# Patient Record
Sex: Male | Born: 1955 | Race: White | Hispanic: No | Marital: Single | Smoking: Current every day smoker
Health system: Southern US, Community
[De-identification: ages and names within clinical notes are randomized; demographics above are authoritative.]

## PROBLEM LIST (undated history)

## (undated) DIAGNOSIS — E119 Type 2 diabetes mellitus without complications: Secondary | ICD-10-CM

## (undated) DIAGNOSIS — E7800 Pure hypercholesterolemia, unspecified: Secondary | ICD-10-CM

## (undated) DIAGNOSIS — F17200 Nicotine dependence, unspecified, uncomplicated: Secondary | ICD-10-CM

## (undated) DIAGNOSIS — I10 Essential (primary) hypertension: Secondary | ICD-10-CM

## (undated) DIAGNOSIS — K219 Gastro-esophageal reflux disease without esophagitis: Secondary | ICD-10-CM

---

## 2007-11-09 ENCOUNTER — Emergency Department (HOSPITAL_COMMUNITY): Admission: EM | Admit: 2007-11-09 | Discharge: 2007-11-10 | Payer: Self-pay | Admitting: Emergency Medicine

## 2013-03-31 ENCOUNTER — Encounter (HOSPITAL_COMMUNITY): Payer: Self-pay

## 2013-03-31 ENCOUNTER — Emergency Department (HOSPITAL_COMMUNITY)
Admission: EM | Admit: 2013-03-31 | Discharge: 2013-03-31 | Disposition: A | Discharge status: Home / Self Care | Payer: Self-pay | Attending: Emergency Medicine | Admitting: Emergency Medicine

## 2013-03-31 DIAGNOSIS — IMO0002 Reserved for concepts with insufficient information to code with codable children: Secondary | ICD-10-CM | POA: Insufficient documentation

## 2013-03-31 DIAGNOSIS — S50812A Abrasion of left forearm, initial encounter: Secondary | ICD-10-CM
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
## 2013-03-31 NOTE — ED Notes (Signed)
Waiting on [facility] to take pictures

## 2013-04-01 NOTE — ED Provider Notes (Signed)
Medical screening examination/treatment/procedure(s) were performed by non-physician practitioner and as supervising physician I was immediately available for consultation/collaboration.  Harwood Nall L Armonie Staten, MD 04/01/13 1527 

## 2017-08-04 ENCOUNTER — Encounter (HOSPITAL_COMMUNITY): Payer: Self-pay | Admitting: Emergency Medicine

## 2017-08-04 DIAGNOSIS — Z7984 Long term (current) use of oral hypoglycemic drugs: Secondary | ICD-10-CM | POA: Insufficient documentation

## 2017-08-04 DIAGNOSIS — K0889 Other specified disorders of teeth and supporting structures: Secondary | ICD-10-CM | POA: Insufficient documentation

## 2017-08-04 DIAGNOSIS — R202 Paresthesia of skin: Secondary | ICD-10-CM | POA: Insufficient documentation

## 2017-08-04 DIAGNOSIS — I10 Essential (primary) hypertension: Secondary | ICD-10-CM | POA: Insufficient documentation

## 2017-08-04 DIAGNOSIS — F17200 Nicotine dependence, unspecified, uncomplicated: Secondary | ICD-10-CM | POA: Insufficient documentation

## 2017-08-04 DIAGNOSIS — Z79899 Other long term (current) drug therapy: Secondary | ICD-10-CM | POA: Insufficient documentation

## 2017-08-04 DIAGNOSIS — M545 Low back pain: Secondary | ICD-10-CM | POA: Insufficient documentation

## 2017-08-04 DIAGNOSIS — E119 Type 2 diabetes mellitus without complications: Secondary | ICD-10-CM | POA: Insufficient documentation

## 2017-08-04 NOTE — ED Triage Notes (Signed)
Pt c/o left sided lower back pain that is non-radiating, after lifting a bench earlier today. No bladder/bowel incontinence. Also c/o loose tooth.

## 2017-08-05 ENCOUNTER — Emergency Department (HOSPITAL_COMMUNITY)
Admission: EM | Admit: 2017-08-05 | Discharge: 2017-08-06 | Disposition: A | Discharge status: Home / Self Care | Payer: Self-pay | Attending: Emergency Medicine | Admitting: Emergency Medicine

## 2017-08-05 ENCOUNTER — Encounter (HOSPITAL_COMMUNITY): Payer: Self-pay | Admitting: Emergency Medicine

## 2017-08-05 ENCOUNTER — Emergency Department (HOSPITAL_COMMUNITY): Payer: Self-pay

## 2017-08-05 ENCOUNTER — Emergency Department (HOSPITAL_COMMUNITY)
Admission: EM | Admit: 2017-08-05 | Discharge: 2017-08-05 | Disposition: A | Discharge status: Home / Self Care | Payer: Self-pay | Attending: Emergency Medicine | Admitting: Emergency Medicine

## 2017-08-05 DIAGNOSIS — M545 Low back pain, unspecified: Secondary | ICD-10-CM

## 2017-08-05 DIAGNOSIS — R197 Diarrhea, unspecified: Secondary | ICD-10-CM | POA: Insufficient documentation

## 2017-08-05 DIAGNOSIS — K0889 Other specified disorders of teeth and supporting structures: Secondary | ICD-10-CM

## 2017-08-05 DIAGNOSIS — E119 Type 2 diabetes mellitus without complications: Secondary | ICD-10-CM | POA: Insufficient documentation

## 2017-08-05 DIAGNOSIS — R202 Paresthesia of skin: Secondary | ICD-10-CM

## 2017-08-05 DIAGNOSIS — R112 Nausea with vomiting, unspecified: Secondary | ICD-10-CM | POA: Insufficient documentation

## 2017-08-05 DIAGNOSIS — I10 Essential (primary) hypertension: Secondary | ICD-10-CM | POA: Insufficient documentation

## 2017-08-05 DIAGNOSIS — F17210 Nicotine dependence, cigarettes, uncomplicated: Secondary | ICD-10-CM | POA: Insufficient documentation

## 2017-08-05 HISTORY — DX: Type 2 diabetes mellitus without complications: E11.9

## 2017-08-05 HISTORY — DX: Essential (primary) hypertension: I10

## 2017-08-05 LAB — URINALYSIS, ROUTINE W REFLEX MICROSCOPIC
Bacteria, UA: NONE SEEN
Bilirubin Urine: NEGATIVE
GLUCOSE, UA: NEGATIVE mg/dL
Ketones, ur: NEGATIVE mg/dL
Leukocytes, UA: NEGATIVE
NITRITE: NEGATIVE
PROTEIN: NEGATIVE mg/dL
SPECIFIC GRAVITY, URINE: 1.021 (ref 1.005–1.030)
Squamous Epithelial / LPF: NONE SEEN
pH: 5 (ref 5.0–8.0)

## 2017-08-05 LAB — DIFFERENTIAL
Basophils Absolute: 0 10*3/uL (ref 0.0–0.1)
Basophils Relative: 1 %
Eosinophils Absolute: 0.2 10*3/uL (ref 0.0–0.7)
Eosinophils Relative: 4 %
LYMPHS ABS: 2.5 10*3/uL (ref 0.7–4.0)
Lymphocytes Relative: 38 %
MONO ABS: 0.4 10*3/uL (ref 0.1–1.0)
Monocytes Relative: 6 %
NEUTROS ABS: 3.3 10*3/uL (ref 1.7–7.7)
NEUTROS PCT: 51 %

## 2017-08-05 LAB — CBC
HCT: 43.3 % (ref 39.0–52.0)
HEMOGLOBIN: 14.9 g/dL (ref 13.0–17.0)
MCH: 33.6 pg (ref 26.0–34.0)
MCHC: 34.4 g/dL (ref 30.0–36.0)
MCV: 97.7 fL (ref 78.0–100.0)
Platelets: 192 10*3/uL (ref 150–400)
RBC: 4.43 MIL/uL (ref 4.22–5.81)
RDW: 13.3 % (ref 11.5–15.5)
WBC: 6.5 10*3/uL (ref 4.0–10.5)

## 2017-08-05 MED ORDER — LOPERAMIDE HCL 2 MG PO CAPS
4.0000 mg | ORAL_CAPSULE | Freq: Once | ORAL | Status: AC
  Administered 2017-08-05: 4 mg via ORAL
  Filled 2017-08-05: qty 2

## 2017-08-05 MED ORDER — HYDROCODONE-ACETAMINOPHEN 7.5-325 MG PO TABS: 1.0000 | ORAL_TABLET | Freq: Four times a day (QID) | ORAL | 0 refills | Status: DC | PRN

## 2017-08-05 MED ORDER — SODIUM CHLORIDE 0.9 % IV BOLUS (SEPSIS)
1000.0000 mL | Freq: Once | INTRAVENOUS | Status: AC
  Administered 2017-08-05: 1000 mL via INTRAVENOUS

## 2017-08-05 MED ORDER — METHOCARBAMOL 500 MG PO TABS: 500.0000 mg | ORAL_TABLET | Freq: Two times a day (BID) | ORAL | 0 refills | Status: DC

## 2017-08-05 MED ORDER — ONDANSETRON HCL 4 MG/2ML IJ SOLN
4.0000 mg | Freq: Once | INTRAMUSCULAR | Status: AC
  Administered 2017-08-05: 4 mg via INTRAVENOUS
  Filled 2017-08-05: qty 2

## 2017-08-05 MED ORDER — HYDROCODONE-ACETAMINOPHEN 5-325 MG PO TABS
1.0000 | ORAL_TABLET | Freq: Once | ORAL | Status: AC
  Administered 2017-08-05: 1 via ORAL
  Filled 2017-08-05: qty 1

## 2017-08-05 MED ORDER — CYCLOBENZAPRINE HCL 10 MG PO TABS: 10.0000 mg | ORAL_TABLET | Freq: Two times a day (BID) | ORAL | 0 refills | Status: DC | PRN

## 2017-08-05 NOTE — ED Provider Notes (Signed)
[facility]-EMERGENCY DEPT Provider Note   CSN: [id number] Arrival date & time: 08/05/17  2006     History   Chief Complaint Chief Complaint  Patient presents with  . Abdominal Pain  . Cough    HPI Warren Gray is a 61 y.o. male.  The history is provided by the patient.  he had onset this morning of upper abdominal cramping with associated nausea, vomiting, diarrhea. He reports his pain at 6/10. Pain is somewhat better after vomiting in after having a bowel movement. Nothing makes it worse. He had subjective fever but no chills or sweats. He states he vomited once and had 3 loose bowel movements. There was no blood or mucus in stool or emesis. He isn't complaining of ongoing sense of rectal urgency and ongoing nausea. He denies any sick contacts. Of note, he had been seen in emergency last night for low back pain with associated scrotal paresthesia. His back is feeling much better and he no longer has scrotal paresthesia.  Past Medical History:  Diagnosis Date  . Diabetes mellitus without complication (HCC)   . Hypertension     There are no active problems to display for this patient.   History reviewed. No pertinent surgical history.     Home Medications    Prior to Admission medications   Medication Sig Start Date End Date Taking? Authorizing Provider  HYDROcodone-acetaminophen (NORCO) 7.5-325 MG tablet Take 1 tablet by mouth every 6 (six) hours as needed for moderate pain. 08/05/17   Maxwell Caul, PA-C  lisinopril (PRINIVIL,ZESTRIL) 20 MG tablet Take 10 mg by mouth daily. 07/24/17   [provider]  metFORMIN (GLUMETZA) 500 MG (MOD) 24 hr tablet Take 500 mg by mouth daily. 07/24/17   [provider]  methocarbamol (ROBAXIN) 500 MG tablet Take 1 tablet (500 mg total) by mouth 2 (two) times daily. 08/05/17   Maxwell Caul, PA-C    Family History No family history on file.  Social History Social History  Substance Use Topics  . Smoking status:  Current Every Day Smoker    Packs/day: 1.00    Types: Cigarettes  . Smokeless tobacco: Never Used  . Alcohol use Yes     Allergies   Zoloft [sertraline hcl]   Review of Systems Review of Systems  All other systems reviewed and are negative.    Physical Exam Updated Vital Signs BP (!) 178/105   Pulse 75   Temp 97.8 F (36.6 C) (Oral)   Resp 18   Ht  (1.702 m)   Wt 70.3 kg (155 lb)   SpO2 99%   BMI 24.28 kg/m   Physical Exam  Nursing note and vitals reviewed.  61 year old male, resting comfortably and in no acute distress. Vital signs are significant for hypertension. Oxygen saturation is 99%, which is normal. Head is normocephalic and atraumatic. PERRLA, EOMI. Oropharynx is clear. Neck is nontender and supple without adenopathy or JVD. Back is nontender and there is no CVA tenderness. Lungs are clear without rales, wheezes, or rhonchi. Chest is nontender. Heart has regular rate and rhythm without murmur. Abdomen is soft, flat, with mild epigastric tenderness. There is no rebound or guarding. There are no masses or hepatosplenomegaly and peristalsis is hypoactive. Extremities have trace edema, full range of motion is present. Skin is warm and dry without rash. Neurologic: Mental status is normal, cranial nerves are intact, there are no motor or sensory deficits.  ED Treatments / Results  Labs (all labs ordered  are listed, but only abnormal results are displayed) Labs Reviewed  LIPASE, BLOOD - Abnormal; Notable for the following:       Result Value   Lipase 58 (*)    All other components within normal limits  URINALYSIS, ROUTINE W REFLEX MICROSCOPIC - Abnormal; Notable for the following:    Hgb urine dipstick SMALL (*)    All other components within normal limits  COMPREHENSIVE METABOLIC PANEL  CBC  DIFFERENTIAL    Procedures Procedures (including critical care time)  Medications Ordered in ED Medications  sodium chloride 0.9 % bolus 1,000 mL (not  administered)  loperamide (IMODIUM) capsule 4 mg (not administered)  ondansetron (ZOFRAN) injection 4 mg (not administered)     Initial Impression / Assessment and Plan / ED Course  I have reviewed the triage vital signs and the nursing notes.  Pertinent lab results that were available during my care of the patient were reviewed by me and considered in my medical decision making (see chart for details).  Abdominal cramping, nausea, vomiting, diarrhea in pattern strongly suggestive of viral gastroenteritis. No red flags to suggest more serious illness. We'll check screening labs and give IV fluids, ondansetron, loperamide. Old records are reviewed confirming ED visit for low back pain and scrotal paresthesia last night with MRI showing multiple areas of disc bulging.  He feels much better after above noted treatment. He is discharged with prescription for ondansetron and told to use over-the-counter loperamide.  Final Clinical Impressions(s) / ED Diagnoses   Final diagnoses:  Nausea vomiting and diarrhea    New Prescriptions New Prescriptions   ONDANSETRON (ZOFRAN) 4 MG TABLET    Take 1 tablet (4 mg total) by mouth every 6 (six) hours as needed for nausea.     Dione Booze, MD 08/06/17 [phone]

## 2017-08-05 NOTE — ED Triage Notes (Signed)
Pt comes in with complaints of generalized abdominal pain with diarrhea.  Endorses cough that makes him feel like he will get sick.  Endorses cold chills. Was seen at [facility] earlier today for unrelated issue. Ambulatory. A&O x4.

## 2017-08-05 NOTE — Discharge Instructions (Signed)
You can take Tylenol or Ibuprofen as directed for pain. You can alternate Tylenol and Ibuprofen every 4 hours. If you take Tylenol at 1pm, then you can take Ibuprofen at 5pm. Then you can take Tylenol again at 9pm.   You can take the pain medication for severe or breakthrough pain. Do not take the pain medication at the same time as tylenol or ibuprofen.   Take Robaxin as prescribed. This medication will make you drowsy so do not drive or drink alcohol when taking it.  As we discussed, he will need to follow up with the referred neurosurgery provider For evaluation of the bulging disc. Call them and arrange for an appointment for further outpatient evaluation.   Follow-up with the referred dentists for further dental treatment.   Return to the Emergency Department immediately for any worsening back pain, neck pain, difficulty walking, numbness/weaknss of your arms or legs, urinary or bowel accidents, fever or any other worsening or concerning symptoms.   CALL YOUR DENTIST OR RETURN IMMEDIATELY IF you develop a fever, rash, difficulty breathing or swallowing, neck or facial swelling, or other potentially serious concerns.
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
## 2017-08-05 NOTE — ED Provider Notes (Signed)
[facility]-EMERGENCY DEPT Provider Note   CSN: [id number] Arrival date & time: 08/04/17  2307     History   Chief Complaint Chief Complaint  Patient presents with  . Back Pain  . Dental Pain    HPI Warren Gray is a 61 y.o. male began this evening after lifting a bench. Patient states that he was attempting to carry the bench when he started experiencing left-sided lower back pain. He states that the pain does not radiate but stays right at the left lower back.Patient states that he has been able to ambulate since this happened. He reports he has been taking ibuprofen with no improvement in symptoms. Patient does report that he is having some numbness to his scrotum. He states that he is able to feel but that the right side just feels "all numb." Patient also reports that he has a right lower tooth that is loose that has been causing him pain. He has not been able to be seen by a dentist recently. He denies any fevers, chills, difficulty swallowing difficulty eating, facial swelling.Denies fevers, weight loss, numbness/weakness of upper and lower extremities, bowel/bladder incontinence, history of back surgery, history of IVDA.    The history is provided by the patient.    Past Medical History:  Diagnosis Date  . Diabetes mellitus without complication (HCC)   . Hypertension     There are no active problems to display for this patient.   History reviewed. No pertinent surgical history.   Home Medications    Prior to Admission medications   Medication Sig Start Date End Date Taking? Authorizing Provider  lisinopril (PRINIVIL,ZESTRIL) 20 MG tablet Take 10 mg by mouth daily. 07/24/17  Yes [provider]  metFORMIN (GLUMETZA) 500 MG (MOD) 24 hr tablet Take 500 mg by mouth daily. 07/24/17  Yes [provider]  cyclobenzaprine (FLEXERIL) 10 MG tablet Take 1 tablet (10 mg total) by mouth 2 (two) times daily as needed for muscle spasms. 08/05/17   Maxwell Caul, PA-C     Family History No family history on file.  Social History Social History  Substance Use Topics  . Smoking status: Current Every Day Smoker  . Smokeless tobacco: Never Used  . Alcohol use Yes     Allergies   Zoloft [sertraline hcl]   Review of Systems Review of Systems  Constitutional: Negative for fever.  HENT: Positive for dental problem. Negative for congestion.   Respiratory: Negative for cough and shortness of breath.   Cardiovascular: Negative for chest pain.  Gastrointestinal: Negative for abdominal pain, diarrhea, nausea and vomiting.  Genitourinary: Negative for dysuria and hematuria.  Musculoskeletal: Positive for back pain. Negative for gait problem and neck pain.  Skin: Negative for rash.  Neurological: Positive for numbness. Negative for weakness.     Physical Exam Updated Vital Signs BP 134/73   Pulse 77   Temp 97.8 F (36.6 C) (Oral)   Resp 18   Ht  (1.702 m)   Wt 70.3 kg (155 lb)   SpO2 97%   BMI 24.28 kg/m   Physical Exam  Constitutional: He is oriented to person, place, and time. He appears well-developed and well-nourished.  Sleeping comfortably on examination table.   HENT:  Head: Normocephalic and atraumatic.  Mouth/Throat: Uvula is midline, oropharynx is clear and moist and mucous membranes are normal. No trismus in the jaw. Abnormal dentition.  Multiple missing teeth. Patient's tooth #28 is slightly loose. No surrounding gingival erythema or swelling. No evidence of  dental abscess. No fluctuance noted. Uvula is midline. No evidence of trismus. No facial or neck swelling.  Eyes: Pupils are equal, round, and reactive to light. Conjunctivae, EOM and lids are normal.  Neck: Full passive range of motion without pain.  Full flexion/extension and lateral movement of neck fully intact. No bony midline tenderness. No deformities or crepitus.   Cardiovascular: Normal rate, regular rhythm, normal heart sounds and normal pulses.  Exam reveals  no gallop and no friction rub.   No murmur heard. Pulmonary/Chest: Effort normal and breath sounds normal.  Abdominal: Soft. Normal appearance. There is no tenderness. There is no rigidity and no guarding. Hernia confirmed negative in the right inguinal area and confirmed negative in the left inguinal area.  Genitourinary: Rectum normal, testes normal and penis normal. Rectal exam shows no mass, no tenderness and anal tone normal. Right testis shows no mass, no swelling and no tenderness. Left testis shows no mass, no swelling and no tenderness. Uncircumcised.  Genitourinary Comments: The exam was performed with a chaperone present. I performed a DRE that showed normal anal tone. Normal male genitalia. No evidence of erythema, swelling, warmth noted to bilateral testicles.  Musculoskeletal: Normal range of motion.       Thoracic back: He exhibits no tenderness.       Back:  Diffuse muscular tenderness overlying the lower lumbar region, most notably on the paraspinal muscles of the bilateral lumbar region. No deformity or crepitus noted.  Neurological: He is alert and oriented to person, place, and time.  Follows commands, Moves all extremities  5/5 strength to BUE and BLE  Sensation intact throughout all major nerve distributions of the BUE and BLE Normal gait   Skin: Skin is warm and dry. Capillary refill takes less than 2 seconds.  Psychiatric: He has a normal mood and affect. His speech is normal.  Nursing note and vitals reviewed.    ED Treatments / Results  Labs (all labs ordered are listed, but only abnormal results are displayed) Labs Reviewed - No data to display  EKG  EKG Interpretation None       Radiology Mr Lumbar Spine Wo Contrast  Result Date: 08/05/2017 CLINICAL DATA:  Back pain after lifting injury EXAM: MRI LUMBAR SPINE WITHOUT CONTRAST TECHNIQUE: Multiplanar, multisequence MR imaging of the lumbar spine was performed. No intravenous contrast was administered.  COMPARISON:  None. FINDINGS: Segmentation:  Standard. Alignment:  Physiologic. Vertebrae:  No fracture, evidence of discitis, or bone lesion. Conus medullaris: Extends to the L1 level and appears normal. Paraspinal and other soft tissues: Negative. Disc levels: The intervertebral disc spaces at L3-4 and above are unremarkable. L4-L5: Medium-sized disc bulge, left eccentric, in close proximity to the left L4 nerve root in the extraforaminal zone. Mild central spinal canal stenosis. Severe bilateral neural foraminal stenosis. Moderate bilateral facet hypertrophy. L5-S1: Severe right facet hypertrophy with right-greater-than-left endplate ridging and small central disc protrusion. Severe right neural foraminal stenosis. No spinal canal or left neural foraminal stenosis. IMPRESSION: 1. Medium-sized L4-5 disc bulge, eccentric to the left, contacting the left L4 nerve root in the extraforaminal zone. Mild spinal canal and severe bilateral neural foraminal stenosis at this level. 2. Severe right L5-S1 neural foraminal stenosis due to combination of right facet hypertrophy and right eccentric disc osteophyte complex. Electronically Signed   By: Deatra Robinson M.D.   On: 08/05/2017 05:01    Procedures Procedures (including critical care time)  Medications Ordered in ED Medications  HYDROcodone-acetaminophen (NORCO/VICODIN) 5-325 MG per  tablet 1 tablet (1 tablet Oral Given 08/05/17 0341)     Initial Impression / Assessment and Plan / ED Course  I have reviewed the triage vital signs and the nursing notes.  Pertinent labs & imaging results that were available during my care of the patient were reviewed by me and considered in my medical decision making (see chart for details).     61 year old male who presents with left sided lower back pain that began after lifting event. Does also report some numbness in his groin. No gait abnormalities, no numbness/weakness of his arms or legs. Patient is afebrile,  non-toxic appearing, sitting comfortably on examination table. Vital signs reviewed and stable. Normal gait. Concern for musculoskeletal injury versus acute fracture versus dislocation. History/physical examination concerning for spinal abscess or cauda equina. No evidence of dental abscess. History/physical exam or not concerning for peritonsillar abscess, Ludwig angina. Discussed patient with Dr. Preston Fleeting. Given patient's symptoms and concern for saddle anesthesia will obtain MRI of lumbar back. Analgesics provided in the department. Updated patient and wife on plan. They're in agreement.  MRI lumbar spine shows L4-L5 disc bulge with eccentric to the left contacting the left L4 nerve root. There is mention of mild spinal canal and bilateral neuro foraminal stenosis. There is also stenosis noted at L5-S1 level. Discussed patient with Dr. Preston Fleeting. We'll plan to provide outpatient neurosurgery referral for patient to follow-up with.  Discussed results with patient and wife. Patient reports improvement in pain after pain medication. Repeat neuro exam is normal. 5/5 strength BUE and BLE. I personally ambulated the patient and he has a normal gait and no difficulties walking. Discussed plan with patient for treatment with muscle relaxers and NSAIDs. Instructed patient to follow-up with neurosurgery referral for further evaluation. Will also plan to write patient with outpatient dentist referral that he can follow-up with for possible tooth extraction. Strict return precautions discussed. Patient expresses understanding and agreement to plan.   As RN was attempting to discharge patient, wife became concerned about prescriptions. She states the patient is not able to tolerate Flexeril because it makes him nauseous. Offered to prescribe Zofran but wife does not want him to have it. We discussed other potential muscle relaxer options. Wife is also concerned about managing his pain although patient reports that his pain is  improved after initial pain medication and has been ambulatory since. Encouraged use of NSAIDs for pain relief but wife is concerned that one was strong enough. Will give a very short course of pain medications for symptomatic relief. Patient and wife are agreeable to plan.   Final Clinical Impressions(s) / ED Diagnoses   Final diagnoses:  Acute left-sided low back pain without sciatica  Paresthesia  Pain, dental    New Prescriptions New Prescriptions   CYCLOBENZAPRINE (FLEXERIL) 10 MG TABLET    Take 1 tablet (10 mg total) by mouth 2 (two) times daily as needed for muscle spasms.     Maxwell Caul, PA-C 08/05/17 1610    Dione Booze, MD 08/05/17 2300

## 2017-08-06 LAB — COMPREHENSIVE METABOLIC PANEL
ALBUMIN: 4.2 g/dL (ref 3.5–5.0)
ALK PHOS: 80 U/L (ref 38–126)
ALT: 25 U/L (ref 17–63)
ANION GAP: 9 (ref 5–15)
AST: 28 U/L (ref 15–41)
BUN: 15 mg/dL (ref 6–20)
CHLORIDE: 108 mmol/L (ref 101–111)
CO2: 25 mmol/L (ref 22–32)
Calcium: 9.7 mg/dL (ref 8.9–10.3)
Creatinine, Ser: 0.89 mg/dL (ref 0.61–1.24)
GFR calc non Af Amer: >60 mL/min (ref >60)
GLUCOSE: 96 mg/dL (ref 65–99)
Potassium: 3.9 mmol/L (ref 3.5–5.1)
SODIUM: 142 mmol/L (ref 135–145)
Total Bilirubin: 0.7 mg/dL (ref 0.3–1.2)
Total Protein: 7.2 g/dL (ref 6.5–8.1)

## 2017-08-06 LAB — LIPASE, BLOOD: LIPASE: 58 U/L — AB (ref 11–51)

## 2017-08-06 MED ORDER — ONDANSETRON HCL 4 MG PO TABS: 4.0000 mg | ORAL_TABLET | Freq: Four times a day (QID) | ORAL | 0 refills | Status: DC | PRN

## 2017-08-06 NOTE — Discharge Instructions (Signed)
Take loperamide (Imodium A-D) as needed for diarrhea. ?

## 2017-08-09 ENCOUNTER — Telehealth: Payer: Self-pay | Admitting: *Deleted

## 2017-08-09 NOTE — Telephone Encounter (Signed)
Pharmacy called to verify [place]

## 2018-04-21 ENCOUNTER — Other Ambulatory Visit: Payer: Self-pay

## 2018-04-21 ENCOUNTER — Emergency Department (HOSPITAL_COMMUNITY)
Admission: EM | Admit: 2018-04-21 | Discharge: 2018-04-21 | Disposition: A | Discharge status: Home / Self Care | Payer: Medicaid - Out of State | Attending: Emergency Medicine | Admitting: Emergency Medicine

## 2018-04-21 ENCOUNTER — Encounter (HOSPITAL_COMMUNITY): Payer: Self-pay | Admitting: Emergency Medicine

## 2018-04-21 DIAGNOSIS — I10 Essential (primary) hypertension: Secondary | ICD-10-CM | POA: Insufficient documentation

## 2018-04-21 DIAGNOSIS — B356 Tinea cruris: Secondary | ICD-10-CM | POA: Insufficient documentation

## 2018-04-21 DIAGNOSIS — Z7984 Long term (current) use of oral hypoglycemic drugs: Secondary | ICD-10-CM | POA: Insufficient documentation

## 2018-04-21 DIAGNOSIS — F17210 Nicotine dependence, cigarettes, uncomplicated: Secondary | ICD-10-CM | POA: Insufficient documentation

## 2018-04-21 DIAGNOSIS — Z79899 Other long term (current) drug therapy: Secondary | ICD-10-CM | POA: Insufficient documentation

## 2018-04-21 DIAGNOSIS — E119 Type 2 diabetes mellitus without complications: Secondary | ICD-10-CM | POA: Diagnosis not present

## 2018-04-21 DIAGNOSIS — L299 Pruritus, unspecified: Secondary | ICD-10-CM | POA: Diagnosis present

## 2018-04-21 LAB — URINALYSIS, ROUTINE W REFLEX MICROSCOPIC
Bilirubin Urine: NEGATIVE
Glucose, UA: NEGATIVE mg/dL
Hgb urine dipstick: NEGATIVE
Ketones, ur: NEGATIVE mg/dL
Leukocytes, UA: NEGATIVE
Nitrite: NEGATIVE
Protein, ur: NEGATIVE mg/dL
Specific Gravity, Urine: 1.024 (ref 1.005–1.030)
pH: 5 (ref 5.0–8.0)

## 2018-04-21 LAB — CBG MONITORING, ED: Glucose-Capillary: 96 mg/dL (ref 65–99)

## 2018-04-21 MED ORDER — CEPHALEXIN 500 MG PO CAPS: 500.0000 mg | ORAL_CAPSULE | Freq: Four times a day (QID) | ORAL | 0 refills | Status: DC

## 2018-04-21 MED ORDER — KETOCONAZOLE 2 % EX CREA: 1.0000 "application " | TOPICAL_CREAM | Freq: Every day | CUTANEOUS | 0 refills | Status: DC

## 2018-04-21 NOTE — ED Triage Notes (Signed)
C/o groin itching since Friday.  States he was seen 2 weeks ago for same and used cream that initially helped but then it got worse.

## 2018-04-21 NOTE — ED Provider Notes (Signed)
[facility]   CSN: [id number] Arrival date & time: 04/21/18  2120     History   Chief Complaint Chief Complaint  Patient presents with  . groin itching    HPI Warren Gray is a 62 y.o. male.  The history is provided by the patient. No language interpreter was used.    Warren Gray is a 62 y.o. male who presents to the Emergency Department complaining of groin itching. He reports two weeks of itching in his groin, right greater than left. 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 He was seen in the emergency department two weeks ago with steroid cream but is not sure what it is. He denies any fevers, chest pain, abdominal pain, shortness of breath, nausea, vomiting, dysuria. No penile discharge. No new sexual partners. He does have a history of diabetes and does not check his sugar. He is unsure of his home medications. He does drink alcohol heavily and drinks at least a pint of liquor a day.  Past Medical History:  Diagnosis Date  . Diabetes mellitus without complication (HCC)   . Hypertension     There are no active problems to display for this patient.   History reviewed. No pertinent surgical history.      Home Medications    Prior to Admission medications   Medication Sig Start Date End Date Taking? Authorizing Provider  HYDROcodone-acetaminophen (NORCO) 7.5-325 MG tablet Take 1 tablet by mouth every 6 (six) hours as needed for moderate pain. 08/05/17   Maxwell CaulLayden, Lindsey A, PA-C  lisinopril (PRINIVIL,ZESTRIL) 20 MG tablet Take 10 mg by mouth daily. 07/24/17   [provider]  metFORMIN (GLUMETZA) 500 MG (MOD) 24 hr tablet Take 500 mg by mouth daily. 07/24/17   [provider]  methocarbamol (ROBAXIN) 500 MG tablet Take 1 tablet (500 mg total) by mouth 2 (two) times daily. 08/05/17   Maxwell CaulLayden, Lindsey A, PA-C  ondansetron (ZOFRAN) 4 MG tablet Take 1 tablet (4 mg total) by mouth every 6 (six) hours  as needed for nausea. 08/06/17   Dione BoozeGlick, David, MD    Family History No family history on file.  Social History Social History   Tobacco Use  . Smoking status: Current Every Day Smoker    Packs/day: 1.00    Types: Cigarettes  . Smokeless tobacco: Never Used  Substance Use Topics  . Alcohol use: Yes  . Drug use: No     Allergies   Zoloft [sertraline hcl]   Review of Systems Review of Systems  All other systems reviewed and are negative.    Physical Exam Updated Vital Signs BP (!) 170/96 (BP Location: Right Arm)   Pulse 79   Temp 98.6 F (37 C) (Oral)   Resp 16   Ht 5\' 7"  (1.702 m)   Wt 72.6 kg (160 lb)   SpO2 96%   BMI 25.06 kg/m   Physical Exam  Constitutional: He is oriented to person, place, and time. He appears well-developed and well-nourished.  HENT:  Head: Normocephalic and atraumatic.  Cardiovascular: Normal rate and regular rhythm.  No murmur heard. Pulmonary/Chest: Effort normal and breath sounds normal. No respiratory distress.  Abdominal: Soft. There is no tenderness. There is no rebound and no guarding.  Genitourinary:  Genitourinary Comments: 2+ femoral pulses bilaterally.  There is a serpiginous rash to the right upper thigh extending to the inguinal crease. No significant local tenderness. There is no scrotal edema  or tenderness.  Musculoskeletal: He exhibits no edema or tenderness.  Neurological: He is alert and oriented to person, place, and time.  Skin: Skin is warm and dry.  Psychiatric: He has a normal mood and affect. His behavior is normal.  Nursing note and vitals reviewed.    ED Treatments / Results  Labs (all labs ordered are listed, but only abnormal results are displayed) Labs Reviewed  URINALYSIS, ROUTINE W REFLEX MICROSCOPIC  CBG MONITORING, ED    EKG None  Radiology No results found.  Procedures Procedures (including critical care time)  Medications Ordered in ED Medications - No data to display   Initial  Impression / Assessment and Plan / ED Course  I have reviewed the triage vital signs and the nursing notes.  Pertinent labs & imaging results that were available during my care of the patient were reviewed by me and considered in my medical decision making (see chart for details).     Patient with history of diabetes, alcohol abuse here for evaluation of groin itching. He is non-toxic appearing on examination. Examination is consistent with tinea curious. There is a surrounding margin of erythema concerning for possible developing cellulitis. There are no concerning features for abscess, necrotizing soft tissue infection, hernia. Counseled patient on home care, outpatient follow-up and return precautions.  Final Clinical Impressions(s) / ED Diagnoses   Final diagnoses:  Tinea cruris    ED Discharge Orders    None       Tilden Fossa, MD 04/21/18 2320

## 2018-04-21 NOTE — ED Notes (Signed)
Delay in lab draw,  Provider at bedside. 

## 2018-04-24 ENCOUNTER — Emergency Department (HOSPITAL_COMMUNITY)
Admission: EM | Admit: 2018-04-24 | Discharge: 2018-04-24 | Disposition: A | Discharge status: Home / Self Care | Payer: Medicaid - Out of State | Attending: Emergency Medicine | Admitting: Emergency Medicine

## 2018-04-24 ENCOUNTER — Other Ambulatory Visit: Payer: Self-pay

## 2018-04-24 ENCOUNTER — Encounter (HOSPITAL_COMMUNITY): Payer: Self-pay | Admitting: Emergency Medicine

## 2018-04-24 DIAGNOSIS — Z79899 Other long term (current) drug therapy: Secondary | ICD-10-CM | POA: Diagnosis not present

## 2018-04-24 DIAGNOSIS — M549 Dorsalgia, unspecified: Secondary | ICD-10-CM | POA: Diagnosis present

## 2018-04-24 DIAGNOSIS — F17210 Nicotine dependence, cigarettes, uncomplicated: Secondary | ICD-10-CM | POA: Insufficient documentation

## 2018-04-24 DIAGNOSIS — E119 Type 2 diabetes mellitus without complications: Secondary | ICD-10-CM | POA: Insufficient documentation

## 2018-04-24 DIAGNOSIS — Z7984 Long term (current) use of oral hypoglycemic drugs: Secondary | ICD-10-CM | POA: Diagnosis not present

## 2018-04-24 DIAGNOSIS — M62830 Muscle spasm of back: Secondary | ICD-10-CM

## 2018-04-24 DIAGNOSIS — I10 Essential (primary) hypertension: Secondary | ICD-10-CM | POA: Diagnosis not present

## 2018-04-24 MED ORDER — METHOCARBAMOL 500 MG PO TABS: 500.0000 mg | ORAL_TABLET | Freq: Two times a day (BID) | ORAL | 0 refills | Status: DC

## 2018-04-24 MED ORDER — ACETAMINOPHEN 325 MG PO TABS: 650.0000 mg | ORAL_TABLET | Freq: Four times a day (QID) | ORAL | 0 refills | Status: DC | PRN

## 2018-04-24 NOTE — Discharge Instructions (Addendum)
We saw in the ER for your back pain, which appears to be due to muscle spasms. As discussed, massage the area of pain and spasms.  Heat compresses also are helpful.  Take the muscle relaxants as needed.

## 2018-04-24 NOTE — ED Provider Notes (Signed)
[facility] EMERGENCY DEPARTMENT Provider Note   CSN: [id number] Arrival date & time: 04/24/18  0040     History   Chief Complaint Chief Complaint  Patient presents with  . Back Pain    HPI Warren Gray is a 62 y.o. male.  HPI  62 year old male with history of diabetes and hypertension comes in with chief complaint of back pain. Patient states that he was moving a crate when he started noticing pain in his back.  Pain is located in the left lower back laterally.  Pain is constant and worse with movement.  Patient has no history of similar pain in the past. Pt has no associated numbness, weakness, urinary incontinence, urinary retention, bowel incontinence, pins and needle sensation in the perineal area.    Past Medical History:  Diagnosis Date  . Diabetes mellitus without complication (HCC)   . Hypertension     There are no active problems to display for this patient.   History reviewed. No pertinent surgical history.      Home Medications    Prior to Admission medications   Medication Sig Start Date End Date Taking? Authorizing Provider  acetaminophen (TYLENOL) 325 MG tablet Take 2 tablets (650 mg total) by mouth every 6 (six) hours as needed. 04/24/18   [name], MD  cephALEXin (KEFLEX) 500 MG capsule Take 1 capsule (500 mg total) by mouth 4 (four) times daily. 04/21/18   Tilden Fossa, MD  HYDROcodone-acetaminophen [facility]) 7.5-325 MG tablet Take 1 tablet by mouth every 6 (six) hours as needed for moderate pain. 08/05/17   Maxwell Caul, PA-C  ketoconazole (NIZORAL) 2 % cream Apply 1 application topically daily. 04/21/18   Tilden Fossa, MD  lisinopril (PRINIVIL,ZESTRIL) 20 MG tablet Take 10 mg by mouth daily. 07/24/17   [provider]  metFORMIN (GLUMETZA) 500 MG (MOD) 24 hr tablet Take 500 mg by mouth daily. 07/24/17   [provider]  methocarbamol (ROBAXIN) 500 MG tablet Take 1 tablet (500 mg total) by mouth 2 (two)  times daily. 04/24/18   [name], MD  ondansetron (ZOFRAN) 4 MG tablet Take 1 tablet (4 mg total) by mouth every 6 (six) hours as needed for nausea. 08/06/17   Dione Booze, MD    Family History No family history on file.  Social History Social History   Tobacco Use  . Smoking status: Current Every Day Smoker    Packs/day: 1.00    Types: Cigarettes  . Smokeless tobacco: Never Used  Substance Use Topics  . Alcohol use: Yes  . Drug use: No     Allergies   Zoloft [sertraline hcl]   Review of Systems Review of Systems  Constitutional: Positive for activity change.  Genitourinary: Negative for difficulty urinating.  Musculoskeletal: Positive for back pain.  Neurological: Negative for weakness and numbness.     Physical Exam Updated Vital Signs BP 115/74   Pulse 86   Temp 98.3 F (36.8 C) (Oral)   Resp 20   SpO2 100%   Physical Exam  Constitutional: He is oriented to person, place, and time. He appears well-developed.  HENT:  Head: Normocephalic and atraumatic.  Eyes: Pupils are equal, round, and reactive to light. Conjunctivae and EOM are normal.  Neck: Normal range of motion. Neck supple.  Cardiovascular: Normal rate and regular rhythm.  Pulmonary/Chest: Effort normal and breath sounds normal.  Abdominal: Soft. Bowel sounds are normal. He exhibits no distension. There is no tenderness. There is no rebound and no  guarding.  Musculoskeletal:  Patient has no tenderness over the spine. He is noted to have reproducible tenderness over the left lower back laterally, where there is a palpable spasm.  Tenderness is worse with manipulation of the spasm. Patient has normal sensory exam and is able to ambulate/  Neurological: He is alert and oriented to person, place, and time.  Skin: Skin is warm.  Nursing note and vitals reviewed.    ED Treatments / Results  Labs (all labs ordered are listed, but only abnormal results are displayed) Labs Reviewed - No data to  display  EKG None  Radiology No results found.  Procedures Procedures (including critical care time)  Medications Ordered in ED Medications - No data to display   Initial Impression / Assessment and Plan / ED Course  I have reviewed the triage vital signs and the nursing notes.  Pertinent labs & imaging results that were available during my care of the patient were reviewed by me and considered in my medical decision making (see chart for details).     62 year old male with history of hypertension and diabetes comes in with chief complaint of back pain.  Back pain was evoked by him moving heavy furniture.  Patient on exam does not have any red flags to be concerned about spinal cord pathology.  He has no midline spine tenderness, and has a palpable spasm which is tender.  We will treat with muscle relaxants as needed and patient advised to apply firm, deep pressure to the spasm and also use heat and cold compresses.  Final Clinical Impressions(s) / ED Diagnoses   Final diagnoses:  Muscle spasm of back    ED Discharge Orders        Ordered    methocarbamol (ROBAXIN) 500 MG tablet  2 times daily     04/24/18 0529    acetaminophen (TYLENOL) 325 MG tablet  Every 6 hours PRN     04/24/18 0529       Derwood KaplanNanavati, Sherly Brodbeck, MD 04/24/18 [id number]

## 2018-04-24 NOTE — ED Triage Notes (Signed)
Pt states he was moving furniture today and has "pulled" something in his back. Lower back pain worse with any movement.  Has tried ibuprofen today with no relief.

## 2018-04-25 ENCOUNTER — Emergency Department (HOSPITAL_COMMUNITY)
Admission: EM | Admit: 2018-04-25 | Discharge: 2018-04-25 | Disposition: A | Discharge status: Home / Self Care | Payer: Medicaid Other | Attending: Emergency Medicine | Admitting: Emergency Medicine

## 2018-04-25 ENCOUNTER — Other Ambulatory Visit: Payer: Self-pay

## 2018-04-25 ENCOUNTER — Encounter (HOSPITAL_COMMUNITY): Payer: Self-pay | Admitting: Emergency Medicine

## 2018-04-25 ENCOUNTER — Emergency Department (HOSPITAL_COMMUNITY)
Admission: EM | Admit: 2018-04-25 | Discharge: 2018-04-26 | Disposition: A | Discharge status: Home / Self Care | Payer: Medicaid Other | Source: Home / Self Care | Attending: Emergency Medicine | Admitting: Emergency Medicine

## 2018-04-25 DIAGNOSIS — Z5900 Homelessness unspecified: Secondary | ICD-10-CM

## 2018-04-25 DIAGNOSIS — F17210 Nicotine dependence, cigarettes, uncomplicated: Secondary | ICD-10-CM | POA: Insufficient documentation

## 2018-04-25 DIAGNOSIS — M7989 Other specified soft tissue disorders: Secondary | ICD-10-CM

## 2018-04-25 DIAGNOSIS — R197 Diarrhea, unspecified: Secondary | ICD-10-CM

## 2018-04-25 DIAGNOSIS — E119 Type 2 diabetes mellitus without complications: Secondary | ICD-10-CM | POA: Insufficient documentation

## 2018-04-25 DIAGNOSIS — R600 Localized edema: Secondary | ICD-10-CM | POA: Diagnosis present

## 2018-04-25 DIAGNOSIS — I10 Essential (primary) hypertension: Secondary | ICD-10-CM | POA: Insufficient documentation

## 2018-04-25 DIAGNOSIS — L559 Sunburn, unspecified: Secondary | ICD-10-CM | POA: Diagnosis not present

## 2018-04-25 HISTORY — DX: Pure hypercholesterolemia, unspecified: E78.00

## 2018-04-25 LAB — COMPREHENSIVE METABOLIC PANEL
ALT: 48 U/L — AB (ref 0–44)
AST: 58 U/L — ABNORMAL HIGH (ref 15–41)
Albumin: 3.8 g/dL (ref 3.5–5.0)
Alkaline Phosphatase: 73 U/L (ref 38–126)
Anion gap: 10 (ref 5–15)
BUN: 14 mg/dL (ref 8–23)
CALCIUM: 9.1 mg/dL (ref 8.9–10.3)
CHLORIDE: 108 mmol/L (ref 98–111)
CO2: 26 mmol/L (ref 22–32)
CREATININE: 1.2 mg/dL (ref 0.61–1.24)
Glucose, Bld: 119 mg/dL — ABNORMAL HIGH (ref 70–99)
Potassium: 3.3 mmol/L — ABNORMAL LOW (ref 3.5–5.1)
SODIUM: 144 mmol/L (ref 135–145)
TOTAL PROTEIN: 6.8 g/dL (ref 6.5–8.1)
Total Bilirubin: 0.6 mg/dL (ref 0.3–1.2)

## 2018-04-25 LAB — CBC
HCT: 44.8 % (ref 39.0–52.0)
Hemoglobin: 14.6 g/dL (ref 13.0–17.0)
MCH: 32.1 pg (ref 26.0–34.0)
MCHC: 32.6 g/dL (ref 30.0–36.0)
MCV: 98.5 fL (ref 78.0–100.0)
PLATELETS: 192 10*3/uL (ref 150–400)
RBC: 4.55 MIL/uL (ref 4.22–5.81)
RDW: 14 % (ref 11.5–15.5)
WBC: 6.6 10*3/uL (ref 4.0–10.5)

## 2018-04-25 LAB — URINALYSIS, ROUTINE W REFLEX MICROSCOPIC
Bilirubin Urine: NEGATIVE
GLUCOSE, UA: NEGATIVE mg/dL
Hgb urine dipstick: NEGATIVE
KETONES UR: NEGATIVE mg/dL
LEUKOCYTES UA: NEGATIVE
Nitrite: NEGATIVE
PROTEIN: NEGATIVE mg/dL
Specific Gravity, Urine: 1.015 (ref 1.005–1.030)
pH: 5 (ref 5.0–8.0)

## 2018-04-25 LAB — LIPASE, BLOOD: LIPASE: 59 U/L — AB (ref 11–51)

## 2018-04-25 MED ORDER — ACETAMINOPHEN 500 MG PO TABS
1000.0000 mg | ORAL_TABLET | Freq: Once | ORAL | Status: DC
  Filled 2018-04-25: qty 2

## 2018-04-25 MED ORDER — ONDANSETRON 4 MG PO TBDP
4.0000 mg | ORAL_TABLET | Freq: Once | ORAL | Status: AC | PRN
  Administered 2018-04-25: 4 mg via ORAL
  Filled 2018-04-25: qty 1

## 2018-04-25 NOTE — ED Triage Notes (Addendum)
Pt presents with bilateral redness and foot swelling s/p tripping and falling onto the side walk. Feet are noted to be red and swollen. Patient unsure is swelling started before or after and unsure of when redness started. Patient ambulatory in triage.

## 2018-04-25 NOTE — ED Notes (Signed)
Pt refused the Tylenol that was offered. Pt and pt's wife asked specifically for narcotics for his pain. I explained to the pt that we are not giving narcotics for sunburn and the only choice is Tylenol per orders per EDP. Pt and pt's wife continued to refuse the Tylenol. EDP notified.

## 2018-04-25 NOTE — ED Notes (Signed)
Per verbal order by EDP, pt is currently soaking in betadine and NS. Pt also has ice packs to shins.

## 2018-04-25 NOTE — ED Triage Notes (Signed)
Pt reports bilateral foot swelling that started yesterday following a mechanical fall. Pt is a diabetic. Foot pain 9/10. [facility] and they told him it was swelling because of his sunburn. There is a rash that runs up the pt's legs, pt reports itchiness. Pt has had decreased appetite,  nausea and diarrhea x6 that started today with intermittent lower abd pain 9/10. Pt reports dark, watery stools but doesn't think there is blood in stool. Pt ambulatory in triage.

## 2018-04-25 NOTE — ED Provider Notes (Signed)
[facility]-EMERGENCY DEPT Provider Note   CSN: [id number] Arrival date & time: 04/25/18  0001     History   Chief Complaint Chief Complaint  Patient presents with  . Foot Swelling    HPI Warren Gray is a 62 y.o. male.  The history is provided by the patient. No language interpreter was used.  Illness  This is a new problem. The current episode started more than 1 week ago. The problem occurs constantly. The problem has been gradually worsening. Pertinent negatives include no chest pain, no abdominal pain, no headaches and no shortness of breath. Nothing aggravates the symptoms. Nothing relieves the symptoms. He has tried nothing for the symptoms. The treatment provided no relief.  Feet are sunburned and minimally edematous after walking out in the sun and heat in poorly fitting flip flops as the patient is homeless.  No CP, no SOB, no DOE.  Making appropriate urine.  Minimal swelling of the dorsum of the foot, no swelling of the legs or ankles.  No weakness or numbness, no trauma.   Past Medical History:  Diagnosis Date  . Diabetes mellitus without complication (HCC)   . High cholesterol   . Hypertension     There are no active problems to display for this patient.   History reviewed. No pertinent surgical history.      Home Medications    Prior to Admission medications   Medication Sig Start Date End Date Taking? Authorizing Provider  acetaminophen (TYLENOL) 325 MG tablet Take 2 tablets (650 mg total) by mouth every 6 (six) hours as needed. Patient taking differently: Take 650 mg by mouth every 6 (six) hours as needed for mild pain.  04/24/18  Yes Nanavati, Ankit, MD  cephALEXin (KEFLEX) 500 MG capsule Take 1 capsule (500 mg total) by mouth 4 (four) times daily. Patient not taking: Reported on 04/25/2018 04/21/18   Tilden Fossa, MD  HYDROcodone-acetaminophen [facility]) 7.5-325 MG tablet Take 1 tablet by mouth every 6 (six) hours as needed for  moderate pain. Patient not taking: Reported on 04/25/2018 08/05/17   Graciella Freer A, PA-C  ketoconazole (NIZORAL) 2 % cream Apply 1 application topically daily. Patient not taking: Reported on 04/25/2018 04/21/18   Tilden Fossa, MD  methocarbamol (ROBAXIN) 500 MG tablet Take 1 tablet (500 mg total) by mouth 2 (two) times daily. Patient not taking: Reported on 04/25/2018 04/24/18   [name], MD  ondansetron (ZOFRAN) 4 MG tablet Take 1 tablet (4 mg total) by mouth every 6 (six) hours as needed for nausea. Patient not taking: Reported on 04/25/2018 08/06/17   Dione Booze, MD    Family History No family history on file.  Social History Social History   Tobacco Use  . Smoking status: Current Every Day Smoker    Packs/day: 1.00    Types: Cigarettes  . Smokeless tobacco: Never Used  Substance Use Topics  . Alcohol use: Yes    Comment: "Sometimes"  . Drug use: No     Allergies   Naproxen and Zoloft [sertraline hcl]   Review of Systems Review of Systems  Constitutional: Negative for fever.  Respiratory: Negative for chest tightness and shortness of breath.   Cardiovascular: Negative for chest pain, palpitations and leg swelling.  Gastrointestinal: Negative for abdominal pain.  Musculoskeletal: Negative for arthralgias, back pain and gait problem.  Skin: Positive for color change.  Neurological: Negative for headaches.  All other systems reviewed and are negative.    Physical Exam Updated Vital Signs  BP 131/76 (BP Location: Left Arm)   Pulse 75   Temp 98.4 F (36.9 C) (Oral)   Resp 16   Ht 5\' 7"  (1.702 m)   Wt 72.6 kg (160 lb)   SpO2 99%   BMI 25.06 kg/m   Physical Exam  Constitutional: He is oriented to person, place, and time. He appears well-developed and well-nourished. No distress.  Sleeping soundly but easily arousable.    HENT:  Head: Normocephalic and atraumatic.  Mouth/Throat: No oropharyngeal exudate.  Eyes: Pupils are equal, round, and reactive  to light. Conjunctivae are normal.  Neck: Normal range of motion. Neck supple.  Cardiovascular: Normal rate, regular rhythm, normal heart sounds and intact distal pulses.  Pulmonary/Chest: Effort normal and breath sounds normal. No stridor. He has no wheezes. He has no rales.  Abdominal: Soft. Bowel sounds are normal. He exhibits no mass. There is no tenderness. There is no rebound and no guarding.  Musculoskeletal: Normal range of motion.  Lines from flip flops 3+ dorsalis pedis   Neurological: He is alert and oriented to person, place, and time. He displays normal reflexes. No cranial nerve deficit. He exhibits normal muscle tone. Coordination normal.  Skin: Skin is warm and dry. Capillary refill takes less than 2 seconds.  Psychiatric: He has a normal mood and affect.  Nursing note and vitals reviewed.    ED Treatments / Results  Labs (all labs ordered are listed, but only abnormal results are displayed) Labs Reviewed - No data to display  EKG None  Radiology No results found.  Procedures Procedures (including critical care time)  Medications Ordered in ED Medications - No data to display     Final Clinical Impressions(s) / ED Diagnoses   Final diagnoses:  Sunburn  Homelessness    Feet cleansed and iced in the ED.  Footie socks placed and patient placed in post op shoes to prevent further injury to the feet.    Return for weakness, numbness, changes in vision or speech, fevers >100.4 unrelieved by medication, shortness of breath, intractable vomiting, or diarrhea, abdominal pain, Inability to tolerate liquids or food, cough, altered mental status or any concerns. No signs of systemic illness or infection. The patient is nontoxic-appearing on exam and vital signs are within normal limits. Will refer to urology for microscopy hematuria as patient is asymptomatic.  I have reviewed the triage vital signs and the nursing notes. Pertinent labs &imaging results that  were available during my care of the patient were reviewed by me and considered in my medical decision making (see chart for details).  After history, exam, and medical workup I feel the patient has been appropriately medically screened and is safe for discharge home. Pertinent diagnoses were discussed with the patient. Patient was given return precautions.    Christifer Chapdelaine, MD 04/25/18 [phone]

## 2018-04-26 ENCOUNTER — Emergency Department (HOSPITAL_COMMUNITY): Payer: Medicaid Other

## 2018-04-26 LAB — CK: CK TOTAL: 96 U/L (ref 49–397)

## 2018-04-26 MED ORDER — SODIUM CHLORIDE 0.9 % IV SOLN: 1000.0000 mL | INTRAVENOUS | Status: DC

## 2018-04-26 MED ORDER — METRONIDAZOLE 500 MG PO TABS: 500.0000 mg | ORAL_TABLET | Freq: Two times a day (BID) | ORAL | 0 refills | Status: DC

## 2018-04-26 MED ORDER — VANCOMYCIN HCL 125 MG PO CAPS: 125.0000 mg | ORAL_CAPSULE | Freq: Four times a day (QID) | ORAL | 0 refills | Status: DC

## 2018-04-26 MED ORDER — SODIUM CHLORIDE 0.9 % IV BOLUS (SEPSIS)
1000.0000 mL | Freq: Once | INTRAVENOUS | Status: AC
  Administered 2018-04-26: 1000 mL via INTRAVENOUS

## 2018-04-26 MED ORDER — ONDANSETRON HCL 4 MG/2ML IJ SOLN
4.0000 mg | Freq: Once | INTRAMUSCULAR | Status: AC
  Administered 2018-04-26: 4 mg via INTRAVENOUS
  Filled 2018-04-26: qty 2

## 2018-04-26 MED ORDER — ALOE VESTA 2-N-1 PROTECTIVE EX OINT: TOPICAL_OINTMENT | Freq: Two times a day (BID) | CUTANEOUS | 0 refills | Status: DC | PRN

## 2018-04-26 MED ORDER — IOHEXOL 300 MG/ML  SOLN
100.0000 mL | Freq: Once | INTRAMUSCULAR | Status: AC | PRN
  Administered 2018-04-26: 100 mL via INTRAVENOUS

## 2018-04-26 NOTE — Discharge Instructions (Addendum)
Keep your feet elevated out of the sun.  Use the aloe cream as prescribed. Take the antibiotics

## 2018-04-26 NOTE — ED Notes (Signed)
PT states understanding of care given, follow up care, and medication prescribed. PT ambulated from ED to car with a steady gait. 

## 2018-04-26 NOTE — ED Provider Notes (Signed)
[facility] EMERGENCY DEPARTMENT Provider Note   CSN: [id number] Arrival date & time: 04/25/18  2224     History   Chief Complaint Chief Complaint  Patient presents with  . Foot Swelling  . Abdominal Pain    HPI Larrell Rapozo is a 62 y.o. male.  Patient returns with bilateral foot swelling and redness for the past 2 days.  He was seen at [facility] long yesterday and told it was due to sunburn.  He comes in today with worsening pain and itchiness.  He is also developed lower abdominal pain with diarrhea x5 or 6.  Denies any vomiting or fever.  He has recently been on antibiotics for a groin infection.  He states his stools have been watery and nonbloody.  No sick contacts or recent travel.  He denies any fevers or urinary symptoms. Patient states that his feet are sunburn and mildly edematous from walking out in the sun and having poorly fitting flip-flops.  No swelling of his legs or ankles.  There is swelling of his dorsal foot bilaterally.  The history is provided by the patient.  Abdominal Pain   Associated symptoms include diarrhea and nausea. Pertinent negatives include fever, vomiting, dysuria, hematuria and headaches.    Past Medical History:  Diagnosis Date  . Diabetes mellitus without complication (HCC)   . High cholesterol   . Hypertension     There are no active problems to display for this patient.   History reviewed. No pertinent surgical history.      Home Medications    Prior to Admission medications   Medication Sig Start Date End Date Taking? Authorizing Provider  acetaminophen (TYLENOL) 325 MG tablet Take 2 tablets (650 mg total) by mouth every 6 (six) hours as needed. Patient taking differently: Take 650 mg by mouth every 6 (six) hours as needed for mild pain.  04/24/18   [name], MD  cephALEXin (KEFLEX) 500 MG capsule Take 1 capsule (500 mg total) by mouth 4 (four) times daily. Patient not taking: Reported on 04/25/2018 04/21/18    Tilden Fossa, MD  HYDROcodone-acetaminophen [facility]) 7.5-325 MG tablet Take 1 tablet by mouth every 6 (six) hours as needed for moderate pain. Patient not taking: Reported on 04/25/2018 08/05/17   Graciella Freer A, PA-C  ketoconazole (NIZORAL) 2 % cream Apply 1 application topically daily. Patient not taking: Reported on 04/25/2018 04/21/18   Tilden Fossa, MD  methocarbamol (ROBAXIN) 500 MG tablet Take 1 tablet (500 mg total) by mouth 2 (two) times daily. Patient not taking: Reported on 04/25/2018 04/24/18   [name], MD  ondansetron (ZOFRAN) 4 MG tablet Take 1 tablet (4 mg total) by mouth every 6 (six) hours as needed for nausea. Patient not taking: Reported on 04/25/2018 08/06/17   Dione Booze, MD    Family History No family history on file.  Social History Social History   Tobacco Use  . Smoking status: Current Every Day Smoker    Packs/day: 1.00    Types: Cigarettes  . Smokeless tobacco: Never Used  Substance Use Topics  . Alcohol use: Yes    Comment: "Sometimes"  . Drug use: No     Allergies   Naproxen and Zoloft [sertraline hcl]   Review of Systems Review of Systems  Constitutional: Negative for activity change, appetite change and fever.  HENT: Negative for congestion and rhinorrhea.   Respiratory: Negative for chest tightness.   Cardiovascular: Negative for chest pain.  Gastrointestinal: Positive for abdominal pain, diarrhea  and nausea. Negative for vomiting.  Genitourinary: Negative for dysuria, hematuria and testicular pain.  Musculoskeletal: Positive for joint swelling. Negative for neck pain.  Skin: Positive for wound.  Neurological: Negative for dizziness, weakness, light-headedness and headaches.   all other systems are negative except as noted in the HPI and PMH.     Physical Exam Updated Vital Signs BP 130/83 (BP Location: Left Arm)   Pulse 69   Temp 97.7 F (36.5 C) (Oral)   Resp 18   SpO2 99%   Physical Exam  Constitutional: He is  oriented to person, place, and time. He appears well-developed and well-nourished. No distress.  HENT:  Head: Normocephalic and atraumatic.  Mouth/Throat: Oropharynx is clear and moist. No oropharyngeal exudate.  Eyes: Pupils are equal, round, and reactive to light. Conjunctivae and EOM are normal.  Neck: Normal range of motion. Neck supple.  No meningismus.  Cardiovascular: Normal rate, regular rhythm, normal heart sounds and intact distal pulses.  No murmur heard. Pulmonary/Chest: Effort normal and breath sounds normal. No respiratory distress.  Abdominal: Soft. There is no tenderness. There is no rebound and no guarding.  Genitourinary:  Genitourinary Comments: Healing rash to right inguinal crease.  There is no testicular edema or tenderness.  No crepitance.  Musculoskeletal: Normal range of motion. He exhibits edema and tenderness.  Edema to dorsal feet bilaterally with erythema that extends to lower shins Compartments are soft.  Intact DP and PT pulses  Neurological: He is alert and oriented to person, place, and time. No cranial nerve deficit. He exhibits normal muscle tone. Coordination normal.  No ataxia on finger to nose bilaterally. No pronator drift. 5/5 strength throughout. CN 2-12 intact.Equal grip strength. Sensation intact.   Skin: Skin is warm.  Psychiatric: He has a normal mood and affect. His behavior is normal.  Nursing note and vitals reviewed.    ED Treatments / Results  Labs (all labs ordered are listed, but only abnormal results are displayed) Labs Reviewed  LIPASE, BLOOD - Abnormal; Notable for the following components:      Result Value   Lipase 59 (*)    All other components within normal limits  COMPREHENSIVE METABOLIC PANEL - Abnormal; Notable for the following components:   Potassium 3.3 (*)    Glucose, Bld 119 (*)    AST 58 (*)    ALT 48 (*)    All other components within normal limits  GASTROINTESTINAL PANEL BY PCR, STOOL (REPLACES STOOL CULTURE)    CBC  URINALYSIS, ROUTINE W REFLEX MICROSCOPIC  CK    EKG None  Radiology Ct Abdomen Pelvis W Contrast  Result Date: 04/26/2018 CLINICAL DATA:  62 year old male with decreased appetite, nausea, and diarrhea. Intermittent lower abdominal pain. EXAM: CT ABDOMEN AND PELVIS WITH CONTRAST TECHNIQUE: Multidetector CT imaging of the abdomen and pelvis was performed using the standard protocol following bolus administration of intravenous contrast. CONTRAST:  OMNIPAQUE IOHEXOL 300 MG/ML  SOLN COMPARISON:  None. FINDINGS: Lower chest: The visualized lung bases are clear. No intra-abdominal free air or free fluid. Hepatobiliary: No focal liver abnormality is seen. No gallstones, gallbladder wall thickening, or biliary dilatation. Pancreas: Unremarkable. No pancreatic ductal dilatation or surrounding inflammatory changes. Spleen: Normal in size without focal abnormality. Adrenals/Urinary Tract: Adrenal glands are unremarkable. Kidneys are normal, without renal calculi, focal lesion, or hydronephrosis. Bladder is unremarkable. Stomach/Bowel: Minimally thickened appearing jejunal folds may be related to underdistention or represent mild enteritis. There is no bowel obstruction. The appendix is normal. Vascular/Lymphatic: Mild  aortoiliac atherosclerotic disease. No portal venous gas. There is no adenopathy. Reproductive: The prostate and seminal vesicles are grossly unremarkable. Other: None Musculoskeletal: Degenerative changes of the spine primarily at L4-L5 and L5-S1. No acute osseous pathology. IMPRESSION: 1. Underdistention of the jejunal folds versus less likely mild enteritis. Clinical correlation is recommended. No bowel obstruction. Normal appendix. 2.  Aortic Atherosclerosis (ICD10-I70.0). Electronically Signed   By: Elgie CollardArash  Radparvar M.D.   On: 04/26/2018 01:58    Procedures Procedures (including critical care time)  Medications Ordered in ED Medications  sodium chloride 0.9 % bolus 1,000 mL  (1,000 mLs Intravenous New Bag/Given 04/26/18 0126)    Followed by  0.9 %  sodium chloride infusion (has no administration in time range)  ondansetron (ZOFRAN-ODT) disintegrating tablet 4 mg (4 mg Oral Given 04/25/18 2241)  ondansetron (ZOFRAN) injection 4 mg (4 mg Intravenous Given 04/26/18 0126)  iohexol (OMNIPAQUE) 300 MG/ML solution 100 mL (100 mLs Intravenous Contrast Given 04/26/18 0143)     Initial Impression / Assessment and Plan / ED Course  I have reviewed the triage vital signs and the nursing notes.  Pertinent labs & imaging results that were available during my care of the patient were reviewed by me and considered in my medical decision making (see chart for details).    Bilateral foot pain and swelling after exposure to sun. NO CP or SOB.  New lower abdominal pain and diarrhea in setting of recent keflex use.   Labs reassuring. IVF given. UA negative.  No diarrhea throughout ED stay.  Work-up is reassuring.  Patient unable to give stool sample.  Will treat empirically for C. difficile given recent antibiotic use.  Advised elevation of feet, ice, topical protectant.  Followup with PCP.  Return precautions discussed. Final Clinical Impressions(s) / ED Diagnoses   Final diagnoses:  Diarrhea of presumed infectious origin  Foot swelling    ED Discharge Orders    None       Gino Garrabrant, Jeannett SeniorStephen, MD 04/26/18 1655

## 2020-03-12 ENCOUNTER — Emergency Department (HOSPITAL_COMMUNITY)
Admission: EM | Admit: 2020-03-12 | Discharge: 2020-03-13 | Disposition: A | Discharge status: Home / Self Care | Payer: Medicare Other | Attending: Emergency Medicine | Admitting: Emergency Medicine

## 2020-03-12 ENCOUNTER — Other Ambulatory Visit: Payer: Self-pay

## 2020-03-12 ENCOUNTER — Encounter (HOSPITAL_COMMUNITY): Payer: Self-pay

## 2020-03-12 DIAGNOSIS — F17210 Nicotine dependence, cigarettes, uncomplicated: Secondary | ICD-10-CM | POA: Diagnosis not present

## 2020-03-12 DIAGNOSIS — Y999 Unspecified external cause status: Secondary | ICD-10-CM | POA: Insufficient documentation

## 2020-03-12 DIAGNOSIS — I10 Essential (primary) hypertension: Secondary | ICD-10-CM | POA: Insufficient documentation

## 2020-03-12 DIAGNOSIS — Y929 Unspecified place or not applicable: Secondary | ICD-10-CM | POA: Diagnosis not present

## 2020-03-12 DIAGNOSIS — W010XXA Fall on same level from slipping, tripping and stumbling without subsequent striking against object, initial encounter: Secondary | ICD-10-CM | POA: Insufficient documentation

## 2020-03-12 DIAGNOSIS — M25512 Pain in left shoulder: Secondary | ICD-10-CM

## 2020-03-12 DIAGNOSIS — Y9389 Activity, other specified: Secondary | ICD-10-CM | POA: Insufficient documentation

## 2020-03-12 NOTE — ED Triage Notes (Signed)
Pt arrives to ED w/ c/o L shoulder pain after falling 2 weeks ago. Pt reports 7/10. Pt endorses full mobility of shoulder. Pt has been taking ibuprofen w/ minimal relief.

## 2020-03-13 ENCOUNTER — Emergency Department (HOSPITAL_COMMUNITY): Payer: Medicare Other

## 2020-03-13 DIAGNOSIS — M25512 Pain in left shoulder: Secondary | ICD-10-CM | POA: Diagnosis not present

## 2020-03-13 MED ORDER — METHOCARBAMOL 500 MG PO TABS: 500.0000 mg | ORAL_TABLET | Freq: Three times a day (TID) | ORAL | 0 refills | Status: DC | PRN

## 2020-03-13 MED ORDER — HYDROCODONE-ACETAMINOPHEN 5-325 MG PO TABS
1.0000 | ORAL_TABLET | Freq: Once | ORAL | Status: AC
  Administered 2020-03-13: 1 via ORAL
  Filled 2020-03-13: qty 1

## 2020-03-13 NOTE — ED Notes (Signed)
Pt d/c home per MD order. Discharge summary reviewed with pt, pt verbalizes understanding. Off unit via WC- reports discharge ride home.  

## 2020-03-13 NOTE — Discharge Instructions (Addendum)
You were evaluated in the Emergency Department and after careful evaluation, we did not find any emergent condition requiring admission or further testing in the hospital.  Your exam/testing today was overall reassuring.  Your symptoms seem to be due to a rotator cuff injury.  It is important to rest the shoulder as much as she can and continue using Tylenol and Motrin.  For more significant pain you can use the muscle relaxer provided.  We recommend you follow-up with the orthopedic specialist for further evaluation.  Please return to the Emergency Department if you experience any worsening of your condition.  We encourage you to follow up with a primary care provider.  Thank you for allowing [place] to be a part of your care.

## 2020-03-13 NOTE — ED Provider Notes (Signed)
[facility] Emergency Department Provider Note MRN:  [id number]  Arrival date & time: 03/13/20     Chief Complaint   Shoulder Pain   History of Present Illness   Warren Gray is a 64 y.o. year-old male with a history of hypertension, diabetes presenting to the ED with chief complaint of shoulder.  Patient fell onto outstretched hand 2 weeks ago and has been having persistent left shoulder pain since that time.  Worse with certain motions or positions.  Ibuprofen helping only minimally at home.  Denies any other significant injuries, abrasion to the knee but no head trauma, no chest pain or shortness of breath, no abdominal pain.  Review of Systems  A complete 10 system review of systems was obtained and all systems are negative except as noted in the HPI and PMH.   Patient's Health History    Past Medical History:  Diagnosis Date  . Diabetes mellitus without complication ([place])   . High cholesterol   . Hypertension     History reviewed. No pertinent surgical history.  No family history on file.  Social History   Socioeconomic History  . Marital status: Single    Spouse name: Not on file  . Number of children: Not on file  . Years of education: Not on file  . Highest education level: Not on file  Occupational History  . Not on file  Tobacco Use  . Smoking status: Current Every Day Smoker    Packs/day: 1.00    Types: Cigarettes  . Smokeless tobacco: Never Used  Substance and Sexual Activity  . Alcohol use: Yes    Comment: "Sometimes"  . Drug use: No  . Sexual activity: Not on file  Other Topics Concern  . Not on file  Social History Narrative  . Not on file   Social Determinants of Health   Financial Resource Strain:   . Difficulty of Paying Living Expenses:   Food Insecurity:   . Worried About Charity fundraiser in the Last Year:   . Arboriculturist in the Last Year:   Transportation Needs:   . Film/video editor (Medical):   Marland Kitchen  Lack of Transportation (Non-Medical):   Physical Activity:   . Days of Exercise per Week:   . Minutes of Exercise per Session:   Stress:   . Feeling of Stress :   Social Connections:   . Frequency of Communication with Friends and Family:   . Frequency of Social Gatherings with Friends and Family:   . Attends Religious Services:   . Active Member of Clubs or Organizations:   . Attends Archivist Meetings:   Marland Kitchen Marital Status:   Intimate Partner Violence:   . Fear of Current or Ex-Partner:   . Emotionally Abused:   Marland Kitchen Physically Abused:   . Sexually Abused:      Physical Exam   Vitals:   03/13/20 0146 03/13/20 0606  BP: 134/73 (!) 161/84  Pulse: 80 68  Resp: 16 18  Temp:  97.8 F (36.6 C)  SpO2: 98% 99%    CONSTITUTIONAL: Well-appearing, NAD NEURO:  Alert and oriented x 3, no focal deficits EYES:  eyes equal and reactive ENT/NECK:  no LAD, no JVD CARDIO: Regular rate, well-perfused, normal S1 and S2 PULM:  CTAB no wheezing or rhonchi GI/GU:  normal bowel sounds, non-distended, non-tender MSK/SPINE:  No gross deformities, no edema, tenderness palpation to the left shoulder with preserved range of motion SKIN:  no rash, atraumatic PSYCH:  Appropriate speech and behavior  *Additional and/or pertinent findings included in MDM below  Diagnostic and Interventional Summary    EKG Interpretation  Date/Time:    Ventricular Rate:    PR Interval:    QRS Duration:   QT Interval:    QTC Calculation:   R Axis:     Text Interpretation:        Labs Reviewed - No data to display  DG Shoulder Left    (Results Pending)    Medications  HYDROcodone-acetaminophen (NORCO/VICODIN) 5-325 MG per tablet 1 tablet (1 tablet Oral Given 03/13/20 9735)     Procedures  /  Critical Care Procedures  ED Course and Medical Decision Making  I have reviewed the triage vital signs, the nursing notes, and pertinent available records from the EMR.  Listed above are laboratory and  imaging tests that I personally ordered, reviewed, and interpreted and then considered in my medical decision making (see below for details).      Exam consistent with rotator cuff injury, however largely preserved function and range of motion.  No signs of infection, normal vital signs, no signs of any other significant trauma.  X-ray to exclude fracture, will refer to orthopedics.    Elmer Sow. Pilar Plate, MD [facility] Emergency Medicine [facility] [email or web address]  Final Clinical Impressions(s) / ED Diagnoses     ICD-10-CM   1. Acute pain of left shoulder  M25.512     ED Discharge Orders         Ordered    methocarbamol (ROBAXIN) 500 MG tablet  Every 8 hours PRN     03/13/20 0649           Discharge Instructions Discussed with and Provided to Patient:     Discharge Instructions     You were evaluated in the Emergency Department and after careful evaluation, we did not find any emergent condition requiring admission or further testing in the hospital.  Your exam/testing today was overall reassuring.  Your symptoms seem to be due to a rotator cuff injury.  Please return to the Emergency Department if you experience any worsening of your condition.  We encourage you to follow up with a primary care provider.  Thank you for allowing [place] to be a part of your care.        Sabas Sous, MD 03/13/20 [phone]

## 2020-09-19 ENCOUNTER — Encounter (HOSPITAL_COMMUNITY): Payer: Self-pay | Admitting: Emergency Medicine

## 2020-09-19 ENCOUNTER — Emergency Department (HOSPITAL_COMMUNITY)
Admission: EM | Admit: 2020-09-19 | Discharge: 2020-09-19 | Disposition: A | Discharge status: Home / Self Care | Payer: No Typology Code available for payment source | Attending: Emergency Medicine | Admitting: Emergency Medicine

## 2020-09-19 ENCOUNTER — Other Ambulatory Visit: Payer: Self-pay

## 2020-09-19 DIAGNOSIS — T25222A Burn of second degree of left foot, initial encounter: Secondary | ICD-10-CM | POA: Diagnosis present

## 2020-09-19 DIAGNOSIS — Y990 Civilian activity done for income or pay: Secondary | ICD-10-CM | POA: Diagnosis not present

## 2020-09-19 DIAGNOSIS — X118XXA Contact with other hot tap-water, initial encounter: Secondary | ICD-10-CM | POA: Insufficient documentation

## 2020-09-19 DIAGNOSIS — Y929 Unspecified place or not applicable: Secondary | ICD-10-CM | POA: Diagnosis not present

## 2020-09-19 DIAGNOSIS — E119 Type 2 diabetes mellitus without complications: Secondary | ICD-10-CM | POA: Diagnosis not present

## 2020-09-19 DIAGNOSIS — F17210 Nicotine dependence, cigarettes, uncomplicated: Secondary | ICD-10-CM | POA: Insufficient documentation

## 2020-09-19 DIAGNOSIS — Y939 Activity, unspecified: Secondary | ICD-10-CM | POA: Insufficient documentation

## 2020-09-19 DIAGNOSIS — I10 Essential (primary) hypertension: Secondary | ICD-10-CM | POA: Diagnosis not present

## 2020-09-19 MED ORDER — HYDROCODONE-ACETAMINOPHEN 5-325 MG PO TABS: 1.0000 | ORAL_TABLET | ORAL | 0 refills | Status: DC | PRN

## 2020-09-19 MED ORDER — HYDROCODONE-ACETAMINOPHEN 5-325 MG PO TABS
1.0000 | ORAL_TABLET | Freq: Once | ORAL | Status: AC
  Administered 2020-09-19: 1 via ORAL
  Filled 2020-09-19: qty 1

## 2020-09-19 MED ORDER — SILVER SULFADIAZINE 1 % EX CREA: 1.0000 "application " | TOPICAL_CREAM | Freq: Every day | CUTANEOUS | 0 refills | Status: DC

## 2020-09-19 MED ORDER — SILVER SULFADIAZINE 1 % EX CREA
TOPICAL_CREAM | Freq: Once | CUTANEOUS | Status: AC
  Administered 2020-09-19: 1 via TOPICAL
  Filled 2020-09-19: qty 85

## 2020-09-19 NOTE — ED Notes (Signed)
ED Provider at bedside. 

## 2020-09-19 NOTE — Discharge Instructions (Addendum)
Make sure to apply antibiotic ointment to the burn daily and apply new dressing.  Monitor for signs of redness or if you start developing fevers or chills.  followup with a doctor to recheck on the wound to make sure it is healing properly
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
## 2020-09-19 NOTE — Progress Notes (Signed)
Orthopedic Tech Progress Note Patient Details:  Warren Gray 04/09/1956 [id number]  Ortho Devices Type of Ortho Device: Postop shoe/boot Ortho Device/Splint Location: lle Ortho Device/Splint Interventions: Ordered, Application, Adjustment   Post Interventions Patient Tolerated: Well Instructions Provided: Care of device, Adjustment of device   Trinna Post 09/19/2020, 9:18 PM

## 2020-09-24 ENCOUNTER — Emergency Department (HOSPITAL_COMMUNITY)
Admission: EM | Admit: 2020-09-24 | Discharge: 2020-09-25 | Disposition: A | Discharge status: Home / Self Care | Payer: Medicare Other | Attending: Emergency Medicine | Admitting: Emergency Medicine

## 2020-09-24 ENCOUNTER — Emergency Department (HOSPITAL_COMMUNITY): Payer: Medicare Other

## 2020-09-24 ENCOUNTER — Other Ambulatory Visit: Payer: Self-pay

## 2020-09-24 ENCOUNTER — Encounter (HOSPITAL_COMMUNITY): Payer: Self-pay | Admitting: Emergency Medicine

## 2020-09-24 DIAGNOSIS — I10 Essential (primary) hypertension: Secondary | ICD-10-CM | POA: Insufficient documentation

## 2020-09-24 DIAGNOSIS — F17210 Nicotine dependence, cigarettes, uncomplicated: Secondary | ICD-10-CM | POA: Insufficient documentation

## 2020-09-24 DIAGNOSIS — X19XXXD Contact with other heat and hot substances, subsequent encounter: Secondary | ICD-10-CM | POA: Diagnosis not present

## 2020-09-24 DIAGNOSIS — T25222D Burn of second degree of left foot, subsequent encounter: Secondary | ICD-10-CM | POA: Insufficient documentation

## 2020-09-24 DIAGNOSIS — R0981 Nasal congestion: Secondary | ICD-10-CM | POA: Diagnosis not present

## 2020-09-24 DIAGNOSIS — E119 Type 2 diabetes mellitus without complications: Secondary | ICD-10-CM | POA: Insufficient documentation

## 2020-09-24 DIAGNOSIS — T551X3A Toxic effect of detergents, assault, initial encounter: Secondary | ICD-10-CM | POA: Insufficient documentation

## 2020-09-24 DIAGNOSIS — T25222A Burn of second degree of left foot, initial encounter: Secondary | ICD-10-CM
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
## 2020-09-24 NOTE — ED Triage Notes (Signed)
Called to triage x's 3 without response

## 2020-09-25 DIAGNOSIS — T551X3A Toxic effect of detergents, assault, initial encounter: Secondary | ICD-10-CM | POA: Diagnosis not present

## 2020-09-25 MED ORDER — BACITRACIN ZINC 500 UNIT/GM EX OINT
TOPICAL_OINTMENT | Freq: Two times a day (BID) | CUTANEOUS | Status: DC
  Filled 2020-09-25: qty 0.9
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
## 2020-09-25 NOTE — Discharge Instructions (Signed)
You may alternate Tylenol 1000 mg every 6 hours as needed for pain, fever and Ibuprofen 800 mg every 8 hours as needed for pain, fever.  Please take Ibuprofen with food.  Do not take more than 4000 mg of Tylenol (acetaminophen) in a 24 hour period.  Your chest xray today showed no acute abnormality.  Please continue to clean and dress your foot wound daily.  Continue your silvadene twice daily until healed.   Steps to find a Primary Care Provider (PCP):  Call [phone] or [phone] to access "[place] Find a Doctor Service."  2.  You may also go on the [facility] website at [email or web address]  3.  [facility] also frequently accepts new patients.  [facility]  [address] [place] [address] [phone]  4.  There are also multiple [facility], [facility] and [facility] practices throughout the [place] that are frequently accepting new patients. You may find a clinic that is close to your home and contact them.  [facility] [email or web address] [phone]  [facility] [email or web address]  [email or web address] [phone]  [facility] [email or web address] [phone]  5.  Local Health Departments also can provide primary care services.  [facility]  [address] [place], [place] [address] [phone]  [facility] Department [address] [place] [address] [phone]  [facility] [address] [place] [address]  [place] [address] [phone]

## 2020-09-29 ENCOUNTER — Other Ambulatory Visit: Payer: Self-pay

## 2020-09-29 ENCOUNTER — Encounter (HOSPITAL_COMMUNITY): Payer: Self-pay

## 2020-09-29 ENCOUNTER — Emergency Department (HOSPITAL_COMMUNITY)
Admission: EM | Admit: 2020-09-29 | Discharge: 2020-09-29 | Disposition: A | Discharge status: Home / Self Care | Payer: No Typology Code available for payment source | Attending: Emergency Medicine | Admitting: Emergency Medicine

## 2020-09-29 DIAGNOSIS — Y990 Civilian activity done for income or pay: Secondary | ICD-10-CM | POA: Diagnosis not present

## 2020-09-29 DIAGNOSIS — I10 Essential (primary) hypertension: Secondary | ICD-10-CM | POA: Diagnosis not present

## 2020-09-29 DIAGNOSIS — E785 Hyperlipidemia, unspecified: Secondary | ICD-10-CM | POA: Diagnosis not present

## 2020-09-29 DIAGNOSIS — T25222A Burn of second degree of left foot, initial encounter: Secondary | ICD-10-CM | POA: Diagnosis present

## 2020-09-29 DIAGNOSIS — X19XXXD Contact with other heat and hot substances, subsequent encounter: Secondary | ICD-10-CM | POA: Diagnosis not present

## 2020-09-29 DIAGNOSIS — F17210 Nicotine dependence, cigarettes, uncomplicated: Secondary | ICD-10-CM | POA: Diagnosis not present

## 2020-09-29 DIAGNOSIS — Z5189 Encounter for other specified aftercare: Secondary | ICD-10-CM

## 2020-09-29 DIAGNOSIS — E1169 Type 2 diabetes mellitus with other specified complication: Secondary | ICD-10-CM | POA: Insufficient documentation
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
## 2020-09-29 NOTE — Discharge Instructions (Signed)
Please return to the ER with any new or worsening symptoms. It was a pleasure to meet you.  
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
## 2020-10-03 ENCOUNTER — Encounter (HOSPITAL_COMMUNITY): Payer: Self-pay | Admitting: Emergency Medicine

## 2020-10-03 ENCOUNTER — Emergency Department (HOSPITAL_COMMUNITY)
Admission: EM | Admit: 2020-10-03 | Discharge: 2020-10-04 | Disposition: A | Discharge status: Home / Self Care | Payer: Worker's Compensation | Attending: Emergency Medicine | Admitting: Emergency Medicine

## 2020-10-03 ENCOUNTER — Other Ambulatory Visit: Payer: Self-pay

## 2020-10-03 DIAGNOSIS — F17210 Nicotine dependence, cigarettes, uncomplicated: Secondary | ICD-10-CM | POA: Insufficient documentation

## 2020-10-03 DIAGNOSIS — Y990 Civilian activity done for income or pay: Secondary | ICD-10-CM | POA: Insufficient documentation

## 2020-10-03 DIAGNOSIS — T25222D Burn of second degree of left foot, subsequent encounter: Secondary | ICD-10-CM | POA: Diagnosis present

## 2020-10-03 DIAGNOSIS — Z5189 Encounter for other specified aftercare: Secondary | ICD-10-CM

## 2020-10-03 DIAGNOSIS — X118XXD Contact with other hot tap-water, subsequent encounter: Secondary | ICD-10-CM | POA: Insufficient documentation

## 2020-10-03 DIAGNOSIS — E119 Type 2 diabetes mellitus without complications: Secondary | ICD-10-CM | POA: Insufficient documentation

## 2020-10-03 DIAGNOSIS — Z79899 Other long term (current) drug therapy: Secondary | ICD-10-CM | POA: Diagnosis not present

## 2020-10-03 DIAGNOSIS — I10 Essential (primary) hypertension: Secondary | ICD-10-CM | POA: Diagnosis not present

## 2020-10-03 LAB — COMPREHENSIVE METABOLIC PANEL
ALT: 31 U/L (ref 0–44)
AST: 26 U/L (ref 15–41)
Albumin: 4 g/dL (ref 3.5–5.0)
Alkaline Phosphatase: 78 U/L (ref 38–126)
Anion gap: 15 (ref 5–15)
BUN: 17 mg/dL (ref 8–23)
CO2: 21 mmol/L — ABNORMAL LOW (ref 22–32)
Calcium: 9.8 mg/dL (ref 8.9–10.3)
Chloride: 102 mmol/L (ref 98–111)
Creatinine, Ser: 0.98 mg/dL (ref 0.61–1.24)
GFR, Estimated: >60 mL/min (ref >60)
Glucose, Bld: 172 mg/dL — ABNORMAL HIGH (ref 70–99)
Potassium: 3.7 mmol/L (ref 3.5–5.1)
Sodium: 138 mmol/L (ref 135–145)
Total Bilirubin: 0.6 mg/dL (ref 0.3–1.2)
Total Protein: 7.3 g/dL (ref 6.5–8.1)

## 2020-10-03 LAB — URINALYSIS, ROUTINE W REFLEX MICROSCOPIC
Bilirubin Urine: NEGATIVE
Glucose, UA: NEGATIVE mg/dL
Hgb urine dipstick: NEGATIVE
Ketones, ur: NEGATIVE mg/dL
Leukocytes,Ua: NEGATIVE
Nitrite: NEGATIVE
Protein, ur: NEGATIVE mg/dL
Specific Gravity, Urine: 1.021 (ref 1.005–1.030)
pH: 5 (ref 5.0–8.0)

## 2020-10-03 LAB — CBC WITH DIFFERENTIAL/PLATELET
Abs Immature Granulocytes: 0.01 10*3/uL (ref 0.00–0.07)
Basophils Absolute: 0.1 10*3/uL (ref 0.0–0.1)
Basophils Relative: 1 %
Eosinophils Absolute: 0.2 10*3/uL (ref 0.0–0.5)
Eosinophils Relative: 2 %
HCT: 43.4 % (ref 39.0–52.0)
Hemoglobin: 14.6 g/dL (ref 13.0–17.0)
Immature Granulocytes: 0 %
Lymphocytes Relative: 33 %
Lymphs Abs: 2.7 10*3/uL (ref 0.7–4.0)
MCH: 33 pg (ref 26.0–34.0)
MCHC: 33.6 g/dL (ref 30.0–36.0)
MCV: 98 fL (ref 80.0–100.0)
Monocytes Absolute: 0.6 10*3/uL (ref 0.1–1.0)
Monocytes Relative: 7 %
Neutro Abs: 4.5 10*3/uL (ref 1.7–7.7)
Neutrophils Relative %: 57 %
Platelets: 214 10*3/uL (ref 150–400)
RBC: 4.43 MIL/uL (ref 4.22–5.81)
RDW: 12.5 % (ref 11.5–15.5)
WBC: 7.9 10*3/uL (ref 4.0–10.5)
nRBC: 0 % (ref 0.0–0.2)

## 2020-10-03 LAB — LACTIC ACID, PLASMA: Lactic Acid, Venous: 2.9 mmol/L (ref 0.5–1.9)
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
## 2020-10-04 ENCOUNTER — Encounter (HOSPITAL_COMMUNITY): Payer: Self-pay | Admitting: Student

## 2020-10-04 LAB — LACTIC ACID, PLASMA: Lactic Acid, Venous: 2 mmol/L (ref 0.5–1.9)

## 2020-10-04 MED ORDER — IBUPROFEN 800 MG PO TABS: 800.0000 mg | ORAL_TABLET | Freq: Three times a day (TID) | ORAL | 0 refills | Status: DC | PRN

## 2020-10-04 MED ORDER — IBUPROFEN 800 MG PO TABS
800.0000 mg | ORAL_TABLET | Freq: Once | ORAL | Status: AC
  Administered 2020-10-04: 800 mg via ORAL
  Filled 2020-10-04: qty 1

## 2020-10-04 MED ORDER — SODIUM CHLORIDE 0.9 % IV BOLUS
1000.0000 mL | Freq: Once | INTRAVENOUS | Status: AC
  Administered 2020-10-04: 1000 mL via INTRAVENOUS

## 2020-10-04 MED ORDER — ACETAMINOPHEN 325 MG PO TABS
650.0000 mg | ORAL_TABLET | Freq: Once | ORAL | Status: DC
  Filled 2020-10-04: qty 2
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
## 2020-10-04 NOTE — Discharge Instructions (Addendum)
You were seen in the ER today for a recheck of your burn.  This appears to be healing well.    - Ibuprofen 800 mg tablets- this is a nonsteroidal anti-inflammatory medication that will help with pain and swelling. Be sure to take this medication as prescribed with food, 1 pill every 8 hours as needed,  It should be taken with food, as it can cause stomach upset, and more seriously, stomach bleeding. Do not take other nonsteroidal anti-inflammatory medications with this such as Advil, Motrin, Aleve, Mobic, Goodie Powder, or Motrin.    You make take Tylenol per over the counter dosing with these medications.   We have prescribed you new medication(s) today. Discuss the medications prescribed today with your pharmacist as they can have adverse effects and interactions with your other medicines including over the counter and prescribed medications. Seek medical evaluation if you start to experience new or abnormal symptoms after taking one of these medicines, seek care immediately if you start to experience difficulty breathing, feeling of your throat closing, facial swelling, or rash as these could be indications of a more serious allergic reaction  Continue wound care with daily dressing changes.  Please follow up with either podiatry or with Dr. Ulice Bold within 1 week for recheck of the burn and further management.   Return to the ER for new or worsening symptoms including but not limited to fever, redness around the wound, spreading redness, pus drainage, swelling, or any other concerns.   We have also consulted our social work team to help with primary care establishment.

## 2020-10-04 NOTE — ED Notes (Signed)
During discharge instructions wife was being rude and abrasive towards nurse. She stated "I know how you nurses are and I will be calling tomorrow to see what he was charged for." DC instructions reviewed with patient and wife and instructions for wound care and prescription. Patient was escorted out with wheelchair vss and nad.

## 2020-10-12 ENCOUNTER — Other Ambulatory Visit: Payer: Self-pay

## 2020-10-12 ENCOUNTER — Emergency Department (HOSPITAL_COMMUNITY)
Admission: EM | Admit: 2020-10-12 | Discharge: 2020-10-12 | Disposition: A | Discharge status: Home / Self Care | Payer: Worker's Compensation | Attending: Emergency Medicine | Admitting: Emergency Medicine

## 2020-10-12 ENCOUNTER — Encounter (HOSPITAL_COMMUNITY): Payer: Self-pay

## 2020-10-12 DIAGNOSIS — Z4800 Encounter for change or removal of nonsurgical wound dressing: Secondary | ICD-10-CM | POA: Insufficient documentation

## 2020-10-12 DIAGNOSIS — E119 Type 2 diabetes mellitus without complications: Secondary | ICD-10-CM | POA: Insufficient documentation

## 2020-10-12 DIAGNOSIS — I10 Essential (primary) hypertension: Secondary | ICD-10-CM | POA: Insufficient documentation

## 2020-10-12 DIAGNOSIS — L989 Disorder of the skin and subcutaneous tissue, unspecified: Secondary | ICD-10-CM | POA: Diagnosis present

## 2020-10-12 DIAGNOSIS — Z5189 Encounter for other specified aftercare: Secondary | ICD-10-CM

## 2020-10-12 NOTE — Discharge Instructions (Addendum)
As discussed, your evaluation today has been largely reassuring.  But, it is important that you monitor your condition carefully, and do not hesitate to return to the ED if you develop new, or concerning changes in your condition. ? ?Otherwise, please follow-up with your physician for appropriate ongoing care. ? ?

## 2020-10-12 NOTE — ED Triage Notes (Signed)
Patient arrived stating he has a burn on his left foot that happened a week before thanksgiving. Checked in to "make sure it was healing okay"

## 2020-10-12 NOTE — ED Notes (Signed)
Pt wants to have his left lower  leg wounds checked. Pt leg looks to be healing well, no infection notice. Pt able to ambulate with steady gate. Pt has clean bandages in place

## 2020-10-12 NOTE — ED Provider Notes (Signed)
[facility]-EMERGENCY DEPT Provider Note   CSN: [id number] Arrival date & time: 10/12/20  2255     History Chief Complaint  Patient presents with   Wound Check    Warren Gray is a 64 y.o. male.  HPI Adult male presents with concern for ongoing lesion on his right medial distal foot.  Patient sustained a burn November 21.  He notes that he was working with hot water, when it spilled onto his foot.  At that point he was wearing shoes with holes, and sustained an injury to his right foot.  He notes no interval fever, vomiting, he does have some anxiousness about his foot and his wife seeking medical care tonight and here as well.  He still has some soreness about the foot, but states that he feels as though it is generally improving, but with considerable anxiousness about the wound, ongoing erythema in the area he presents for evaluation.    Past Medical History:  Diagnosis Date   Diabetes mellitus without complication (HCC)    High cholesterol    Hypertension     There are no problems to display for this patient.   History reviewed. No pertinent surgical history.     No family history on file.  Social History   Tobacco Use   Smoking status: Current Every Day Smoker    Packs/day: 1.00    Types: Cigarettes   Smokeless tobacco: Never Used  Vaping Use   Vaping Use: Never used  Substance Use Topics   Alcohol use: Yes    Comment: "Sometimes"   Drug use: No    Home Medications Prior to Admission medications   Medication Sig Start Date End Date Taking? Authorizing Provider  cephALEXin (KEFLEX) 500 MG capsule Take 1 capsule (500 mg total) by mouth 4 (four) times daily. Patient not taking: Reported on 04/25/2018 04/21/18   Tilden Fossa, MD  HYDROcodone-acetaminophen (NORCO/VICODIN) 5-325 MG tablet Take 1 tablet by mouth every 4 (four) hours as needed. 09/19/20   Linwood Dibbles, MD  ibuprofen (ADVIL) 800 MG tablet Take 1 tablet (800 mg total)  by mouth every 8 (eight) hours as needed for moderate pain. 10/04/20   Petrucelli, Samantha R, PA-C  methocarbamol (ROBAXIN) 500 MG tablet Take 1 tablet (500 mg total) by mouth every 8 (eight) hours as needed for muscle spasms. 03/13/20   Sabas Sous, MD  petrolatum-hydrophilic-aloe vera (ALOE VESTA) ointment Apply topically 2 (two) times daily as needed for wound care. 04/26/18   Rancour, Jeannett Senior, MD  silver sulfADIAZINE (SILVADENE) 1 % cream Apply 1 application topically daily. 09/19/20   Linwood Dibbles, MD  vancomycin (VANCOCIN HCL) 125 MG capsule Take 1 capsule (125 mg total) by mouth 4 (four) times daily. 04/26/18   Rancour, Jeannett Senior, MD    Allergies    Naproxen, Sertraline, and Zoloft [sertraline hcl]  Review of Systems   Review of Systems  Constitutional:       Per HPI, otherwise negative  HENT:       Per HPI, otherwise negative  Respiratory:       Per HPI, otherwise negative  Cardiovascular:       Per HPI, otherwise negative  Gastrointestinal: Negative for vomiting.  Endocrine:       Negative aside from HPI  Genitourinary:       Neg aside from HPI   Musculoskeletal:       Per HPI, otherwise negative  Skin: Positive for color change and wound.  Neurological: Negative for  syncope.  Psychiatric/Behavioral: The patient is nervous/anxious.     Physical Exam Updated Vital Signs BP (!) 187/94 (BP Location: Left Arm)    Pulse 72    Temp 97.9 F (36.6 C) (Oral)    Resp 16    SpO2 100%   Physical Exam Vitals and nursing note reviewed.  Constitutional:      General: He is not in acute distress.    Appearance: He is well-developed.  HENT:     Head: Normocephalic and atraumatic.  Eyes:     Extraocular Movements: EOM normal.     Conjunctiva/sclera: Conjunctivae normal.  Cardiovascular:     Rate and Rhythm: Normal rate and regular rhythm.     Pulses: Normal pulses.  Abdominal:     General: There is no distension.  Musculoskeletal:        General: No edema.     Comments:  Erythema, varying degrees of induration around the first MTP medially and dorsally.  Patient has preserved flexion, extension, toes, ankle.  Skin:    General: Skin is warm and dry.     Comments: Skin changes as above.  Neurological:     Mental Status: He is alert and oriented to person, place, and time.  Psychiatric:        Mood and Affect: Mood is anxious.     ED Results / Procedures / Treatments   I reviewed the patient's chart including documentation from his initial visit, and subsequent wound check evaluations.  Procedures Procedures (including critical care time)  Medications Ordered in ED Medications - No data to display  ED Course  I have reviewed the triage vital signs and the nursing notes.  Pertinent labs & imaging results that were available during my care of the patient were reviewed by me and considered in my medical decision making (see chart for details).  Adult male presents 2 weeks after sustaining initial injury, now with concern for healing.  Patient is awake, alert, afebrile, vital signs are unremarkable, he is healing appropriately, with no spreading erythema, no loss of sensation or motor skin, no negation for additional labs.  Patient counseled on appropriate wound care, had a fresh dressing applied, was discharged in stable condition. Final Clinical Impression(s) / ED Diagnoses Final diagnoses:  Visit for wound check    Rx / DC Orders ED Discharge Orders    None       Gerhard Munch, MD 10/12/20 2316

## 2020-10-20 ENCOUNTER — Other Ambulatory Visit: Payer: Self-pay

## 2020-10-20 ENCOUNTER — Emergency Department (HOSPITAL_COMMUNITY)
Admission: EM | Admit: 2020-10-20 | Discharge: 2020-10-20 | Disposition: A | Discharge status: Home / Self Care | Payer: Medicare Other | Attending: Emergency Medicine | Admitting: Emergency Medicine

## 2020-10-20 ENCOUNTER — Encounter (HOSPITAL_COMMUNITY): Payer: Self-pay

## 2020-10-20 DIAGNOSIS — Z5321 Procedure and treatment not carried out due to patient leaving prior to being seen by health care provider: Secondary | ICD-10-CM | POA: Insufficient documentation

## 2020-10-20 DIAGNOSIS — M79604 Pain in right leg: Secondary | ICD-10-CM | POA: Insufficient documentation

## 2020-10-20 NOTE — ED Triage Notes (Signed)
Pt here for eval of R leg/groin pain x2+ wks, stating that he feels like he "pulled a muscle." Unsure if he injured it at work or otherwise, but "wants to make sure it heals up okay." Pt ambulatory to/in triage, no limp noted.

## 2020-10-20 NOTE — ED Notes (Signed)
Called pt x2 for vitals, no response. °

## 2020-10-20 NOTE — ED Notes (Signed)
Pt called 2x for vitals w/o response.

## 2020-11-26 ENCOUNTER — Other Ambulatory Visit: Payer: Self-pay

## 2020-11-26 ENCOUNTER — Emergency Department (HOSPITAL_COMMUNITY): Payer: Medicare Other

## 2020-11-26 ENCOUNTER — Emergency Department (HOSPITAL_COMMUNITY)
Admission: EM | Admit: 2020-11-26 | Discharge: 2020-11-27 | Disposition: A | Discharge status: Home / Self Care | Payer: Medicare Other | Attending: Emergency Medicine | Admitting: Emergency Medicine

## 2020-11-26 DIAGNOSIS — E119 Type 2 diabetes mellitus without complications: Secondary | ICD-10-CM | POA: Diagnosis not present

## 2020-11-26 DIAGNOSIS — M25511 Pain in right shoulder: Secondary | ICD-10-CM | POA: Diagnosis present

## 2020-11-26 DIAGNOSIS — X500XXA Overexertion from strenuous movement or load, initial encounter: Secondary | ICD-10-CM | POA: Insufficient documentation

## 2020-11-26 DIAGNOSIS — F17210 Nicotine dependence, cigarettes, uncomplicated: Secondary | ICD-10-CM | POA: Insufficient documentation

## 2020-11-26 DIAGNOSIS — I10 Essential (primary) hypertension: Secondary | ICD-10-CM | POA: Insufficient documentation

## 2020-11-26 NOTE — ED Triage Notes (Signed)
Pt presents to ED POV. Pt c/o R shoulder pain. Pt states that pain began x1w. Pt denies any injury.

## 2020-11-27 MED ORDER — ACETAMINOPHEN 325 MG PO TABS
650.0000 mg | ORAL_TABLET | Freq: Once | ORAL | Status: AC
  Administered 2020-11-27: 650 mg via ORAL
  Filled 2020-11-27: qty 2

## 2020-11-27 NOTE — ED Notes (Signed)
Patient verbalizes understanding of discharge instructions. Opportunity for questioning and answers were provided. Armband removed by staff, pt discharged from ED ambulatory.   

## 2020-11-27 NOTE — ED Provider Notes (Signed)
[facility] EMERGENCY DEPARTMENT Provider Note   CSN: [id number] Arrival date & time: 11/26/20  2322     History Chief Complaint  Patient presents with  . Shoulder Pain    Warren Gray is a 65 y.o. male.  The history is provided by the patient.  Shoulder Pain Location:  Shoulder Shoulder location:  R shoulder Injury: no   Pain details:    Quality:  Aching   Radiates to:  Does not radiate   Severity:  Moderate   Onset quality:  Gradual   Duration:  1 week   Timing:  Constant   Progression:  Worsening Relieved by:  Nothing Worsened by:  Movement  Patient reports right shoulder pain for the past week.  No trauma.  He does report heavy lifting at work.  No weakness in the arm.  No chest pain or shortness of breath.  No previous surgery to the shoulder    Past Medical History:  Diagnosis Date  . Diabetes mellitus without complication (HCC)   . High cholesterol   . Hypertension     There are no problems to display for this patient.   No past surgical history on file.     No family history on file.  Social History   Tobacco Use  . Smoking status: Current Every Day Smoker    Packs/day: 1.00    Types: Cigarettes  . Smokeless tobacco: Never Used  Vaping Use  . Vaping Use: Never used  Substance Use Topics  . Alcohol use: Yes    Comment: "Sometimes"  . Drug use: No    Home Medications Prior to Admission medications   Medication Sig Start Date End Date Taking? Authorizing Provider  cephALEXin (KEFLEX) 500 MG capsule Take 1 capsule (500 mg total) by mouth 4 (four) times daily. Patient not taking: Reported on 04/25/2018 04/21/18   Tilden Fossa, MD  HYDROcodone-acetaminophen (NORCO/VICODIN) 5-325 MG tablet Take 1 tablet by mouth every 4 (four) hours as needed. 09/19/20   Linwood Dibbles, MD  ibuprofen (ADVIL) 800 MG tablet Take 1 tablet (800 mg total) by mouth every 8 (eight) hours as needed for moderate pain. 10/04/20   Petrucelli, Samantha R,  PA-C  methocarbamol (ROBAXIN) 500 MG tablet Take 1 tablet (500 mg total) by mouth every 8 (eight) hours as needed for muscle spasms. 03/13/20   Sabas Sous, MD  petrolatum-hydrophilic-aloe vera (ALOE VESTA) ointment Apply topically 2 (two) times daily as needed for wound care. 04/26/18   Rancour, Jeannett Senior, MD  silver sulfADIAZINE (SILVADENE) 1 % cream Apply 1 application topically daily. 09/19/20   Linwood Dibbles, MD  vancomycin (VANCOCIN HCL) 125 MG capsule Take 1 capsule (125 mg total) by mouth 4 (four) times daily. 04/26/18   Rancour, Jeannett Senior, MD    Allergies    Naproxen, Sertraline, and Zoloft [sertraline hcl]  Review of Systems   Review of Systems  Cardiovascular: Negative for chest pain.  Musculoskeletal: Positive for arthralgias.    Physical Exam Updated Vital Signs BP (!) 147/77 (BP Location: Right Arm)   Pulse 64   Temp (!) 97.3 F (36.3 C) (Oral)   Resp 18   SpO2 99%   Physical Exam  CONSTITUTIONAL: Well developed/well nourished HEAD: Normocephalic/atraumatic EYES: EOMI NECK: supple no meningeal signs SPINE/BACK:entire spine nontender CV: S1/S2 noted, no murmurs/rubs/gallops noted LUNGS: Lungs are clear to auscultation bilaterally, no apparent distress ABDOMEN: soft NEURO: Pt is awake/alert/appropriate, moves all extremitiesx4.  No facial droop.   EXTREMITIES: pulses normal/equal, full ROM,  mild tenderness noted to anterior right shoulder.  No bruising or crepitus.  No erythema.  He has full range of motion in abduction and abduction of the right shoulder.  Distal pulses equal intact.  Equal power noted in both upper extremities SKIN: warm, color normal PSYCH: no abnormalities of mood noted, alert and oriented to situation   Normal ED Results / Procedures / Treatments   Labs (all labs ordered are listed, but only abnormal results are displayed) Labs Reviewed - No data to display  EKG None  Radiology DG Shoulder Right  Result Date: 11/27/2020 CLINICAL DATA:   Pain right shoulder began 1 week ago. EXAM: RIGHT SHOULDER - 2+ VIEW COMPARISON:  Chest x-ray 09/24/2020 FINDINGS: No evidence of acute displaced fracture or dislocation. Curvilinear density along the inferior glenoid could represent degenerative changes versus possible tiny avulsion fracture. Mild to moderate degenerative changes of the right acromioclavicular joint. No evidence of severe arthropathy. No aggressive appearing focal bone abnormality. Right apical pleural/pulmonary scarring. Soft tissues are unremarkable. IMPRESSION: 1. Curvilinear density along the inferior glenoid could represent degenerative changes versus possible tiny avulsion fracture. 2. Otherwise no definite acute displaced fracture or dislocation of the bones of the right shoulder. 3. Mild to moderate degenerative changes of the right acromioclavicular joint. Electronically Signed   By: Tish Frederickson M.D.   On: 11/27/2020 00:00    Procedures Procedures   Medications Ordered in ED Medications  acetaminophen (TYLENOL) tablet 650 mg (650 mg Oral Given 11/27/20 0932)    ED Course  I have reviewed the triage vital signs and the nursing notes.  Pertinent imaging results that were available during my care of the patient were reviewed by me and considered in my medical decision making (see chart for details).    MDM Rules/Calculators/A&P                          Suspect this is all arthritis or degenerative changes.  Low suspicion for acute fracture as there are no recent falls advise follow-up with orthopedics Final Clinical Impression(s) / ED Diagnoses Final diagnoses:  Acute pain of right shoulder    Rx / DC Orders ED Discharge Orders    None       Zadie Rhine, MD 11/27/20 0121

## 2020-12-05 ENCOUNTER — Other Ambulatory Visit: Payer: Self-pay

## 2020-12-05 ENCOUNTER — Emergency Department (HOSPITAL_COMMUNITY)
Admission: EM | Admit: 2020-12-05 | Discharge: 2020-12-06 | Disposition: A | Discharge status: Home / Self Care | Payer: Medicare Other | Attending: Emergency Medicine | Admitting: Emergency Medicine

## 2020-12-05 DIAGNOSIS — Z5321 Procedure and treatment not carried out due to patient leaving prior to being seen by health care provider: Secondary | ICD-10-CM | POA: Insufficient documentation

## 2020-12-05 DIAGNOSIS — H9319 Tinnitus, unspecified ear: Secondary | ICD-10-CM | POA: Diagnosis present

## 2020-12-05 DIAGNOSIS — R519 Headache, unspecified: Secondary | ICD-10-CM | POA: Diagnosis not present

## 2020-12-05 NOTE — ED Triage Notes (Signed)
Pt presents to ED POV. Pt c/o tinnitus and HA. Pt reports that this has been going for a few weeks. Pt AAO x4

## 2020-12-06 NOTE — ED Notes (Signed)
Pt spoke to tech and decided to leave at this time

## 2020-12-08 ENCOUNTER — Encounter (HOSPITAL_COMMUNITY): Payer: Self-pay

## 2020-12-08 ENCOUNTER — Emergency Department (HOSPITAL_COMMUNITY)
Admission: EM | Admit: 2020-12-08 | Discharge: 2020-12-08 | Disposition: A | Discharge status: Home / Self Care | Payer: Medicare Other | Attending: Emergency Medicine | Admitting: Emergency Medicine

## 2020-12-08 ENCOUNTER — Other Ambulatory Visit: Payer: Self-pay

## 2020-12-08 DIAGNOSIS — R42 Dizziness and giddiness: Secondary | ICD-10-CM | POA: Diagnosis present

## 2020-12-08 DIAGNOSIS — Z5321 Procedure and treatment not carried out due to patient leaving prior to being seen by health care provider: Secondary | ICD-10-CM | POA: Insufficient documentation

## 2020-12-08 DIAGNOSIS — R519 Headache, unspecified: Secondary | ICD-10-CM | POA: Insufficient documentation

## 2020-12-08 LAB — CBC
HCT: 45 % (ref 39.0–52.0)
Hemoglobin: 15 g/dL (ref 13.0–17.0)
MCH: 32.6 pg (ref 26.0–34.0)
MCHC: 33.3 g/dL (ref 30.0–36.0)
MCV: 97.8 fL (ref 80.0–100.0)
Platelets: 215 10*3/uL (ref 150–400)
RBC: 4.6 MIL/uL (ref 4.22–5.81)
RDW: 12.7 % (ref 11.5–15.5)
WBC: 9.6 10*3/uL (ref 4.0–10.5)
nRBC: 0 % (ref 0.0–0.2)

## 2020-12-08 LAB — URINALYSIS, ROUTINE W REFLEX MICROSCOPIC
Bilirubin Urine: NEGATIVE
Glucose, UA: NEGATIVE mg/dL
Hgb urine dipstick: NEGATIVE
Ketones, ur: NEGATIVE mg/dL
Leukocytes,Ua: NEGATIVE
Nitrite: NEGATIVE
Protein, ur: NEGATIVE mg/dL
Specific Gravity, Urine: 1.02 (ref 1.005–1.030)
pH: 5 (ref 5.0–8.0)

## 2020-12-08 LAB — BASIC METABOLIC PANEL
Anion gap: 14 (ref 5–15)
BUN: 15 mg/dL (ref 8–23)
CO2: 20 mmol/L — ABNORMAL LOW (ref 22–32)
Calcium: 9.2 mg/dL (ref 8.9–10.3)
Chloride: 103 mmol/L (ref 98–111)
Creatinine, Ser: 0.93 mg/dL (ref 0.61–1.24)
GFR, Estimated: >60 mL/min (ref >60)
Glucose, Bld: 124 mg/dL — ABNORMAL HIGH (ref 70–99)
Potassium: 3.8 mmol/L (ref 3.5–5.1)
Sodium: 137 mmol/L (ref 135–145)

## 2020-12-08 NOTE — ED Triage Notes (Signed)
Pt reports that he has been having headache and dizziness for the past few days. Reports he does not have a PCP and has multiple other complaints

## 2020-12-08 NOTE — ED Notes (Signed)
Pt states they are leaving  

## 2020-12-20 ENCOUNTER — Other Ambulatory Visit: Payer: Self-pay

## 2020-12-20 ENCOUNTER — Encounter (HOSPITAL_COMMUNITY): Payer: Self-pay | Admitting: Emergency Medicine

## 2020-12-20 ENCOUNTER — Emergency Department (HOSPITAL_COMMUNITY): Payer: Medicare Other

## 2020-12-20 ENCOUNTER — Emergency Department (HOSPITAL_COMMUNITY)
Admission: EM | Admit: 2020-12-20 | Discharge: 2020-12-20 | Disposition: A | Discharge status: Home / Self Care | Payer: Medicare Other | Attending: Emergency Medicine | Admitting: Emergency Medicine

## 2020-12-20 DIAGNOSIS — E119 Type 2 diabetes mellitus without complications: Secondary | ICD-10-CM | POA: Insufficient documentation

## 2020-12-20 DIAGNOSIS — M25551 Pain in right hip: Secondary | ICD-10-CM | POA: Diagnosis present

## 2020-12-20 DIAGNOSIS — M5431 Sciatica, right side: Secondary | ICD-10-CM | POA: Diagnosis not present

## 2020-12-20 DIAGNOSIS — F17210 Nicotine dependence, cigarettes, uncomplicated: Secondary | ICD-10-CM | POA: Insufficient documentation

## 2020-12-20 DIAGNOSIS — I10 Essential (primary) hypertension: Secondary | ICD-10-CM | POA: Diagnosis not present

## 2020-12-20 LAB — CBG MONITORING, ED: Glucose-Capillary: 129 mg/dL — ABNORMAL HIGH (ref 70–99)

## 2020-12-20 MED ORDER — ORPHENADRINE CITRATE ER 100 MG PO TB12: 100.0000 mg | ORAL_TABLET | Freq: Two times a day (BID) | ORAL | 0 refills | Status: DC

## 2020-12-20 NOTE — Discharge Instructions (Signed)
Apply ice as needed.  Continue taking acetaminophen as needed for pain.  Talk with your primary care provider about possible physical therapy to help with your pain.

## 2020-12-20 NOTE — ED Provider Notes (Signed)
[facility] EMERGENCY DEPARTMENT Provider Note   CSN: [id number] Arrival date & time: 12/20/20  2751   History Chief Complaint  Patient presents with  . Hip Pain  . Leg Pain    Warren Gray is a 65 y.o. male.  The history is provided by the patient.  Hip Pain  Leg Pain He has history of hypertension, diabetes, hyperlipidemia and comes in complaining of pain and numbness in the right lateral hip.  He rates his pain at 5/10.  He has been taking acetaminophen for pain with only slight relief.  He states that he does a lot of standing and he thinks that may have been what was causing his problems.  He denies any pain going down past his knee and denies any pain in his back.  Denies any bowel or bladder dysfunction.  He denies any weakness.  Past Medical History:  Diagnosis Date  . Diabetes mellitus without complication (HCC)   . High cholesterol   . Hypertension     There are no problems to display for this patient.   History reviewed. No pertinent surgical history.     No family history on file.  Social History   Tobacco Use  . Smoking status: Current Every Day Smoker    Packs/day: 1.00    Types: Cigarettes  . Smokeless tobacco: Never Used  Vaping Use  . Vaping Use: Never used  Substance Use Topics  . Alcohol use: Yes    Comment: "Sometimes"  . Drug use: No    Home Medications Prior to Admission medications   Medication Sig Start Date End Date Taking? Authorizing Provider  OVER THE COUNTER MEDICATION Take 1 tablet by mouth in the morning and at bedtime. Heartburn medication from [facility]   Yes [provider]    Allergies    Naproxen, Sertraline, and Zoloft [sertraline hcl]  Review of Systems   Review of Systems  All other systems reviewed and are negative.   Physical Exam Updated Vital Signs BP (!) 166/88 (BP Location: Right Arm)   Pulse 70   Temp (!) 97.4 F (36.3 C) (Oral)   Resp 16   SpO2 100%   Physical  Exam Vitals and nursing note reviewed.   65 year old male, resting comfortably and in no acute distress. Vital signs are significant for elevated blood pressure. Oxygen saturation is 100%, which is normal. Head is normocephalic and atraumatic. PERRLA, EOMI. Oropharynx is clear. Neck is nontender and supple without adenopathy or JVD. Back is nontender and there is no CVA tenderness.  Straight leg raise is positive bilaterally at 30 degrees.  Moderate paralumbar spasm is present bilaterally. Lungs are clear without rales, wheezes, or rhonchi. Chest is nontender. Heart has regular rate and rhythm without murmur. Abdomen is soft, flat, nontender without masses or hepatosplenomegaly and peristalsis is normoactive. Extremities have no cyanosis or edema, full range of motion is present. Skin is warm and dry without rash. Neurologic: Mental status is normal, cranial nerves are intact, there are no motor or sensory deficits.  Sensation throughout the right leg is completely normal compared with the left.  He has been ambulatory.  Strength on dorsiflexion and plantarflexion is 5/5 and symmetric.  ED Results / Procedures / Treatments    Radiology DG Hip Unilat W or Wo Pelvis 2-3 Views Right  Result Date: 12/20/2020 CLINICAL DATA:  Right hip pain EXAM: DG HIP (WITH OR WITHOUT PELVIS) 2-3V RIGHT COMPARISON:  None. FINDINGS: No acute bony abnormality. Specifically,  no fracture, subluxation, or dislocation. Soft tissues are free of acute abnormality. Mild degenerative features noted in both hips, right slightly greater than left. Additional background mild to moderate discogenic and facet degenerative changes in lumbar spine. Additional degenerative changes noted in the bilateral SI joints. Atherosclerotic calcifications in the pelvis. IMPRESSION: 1. No acute osseous abnormality. 2. 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 Electronically Signed   By: Kreg Shropshire M.D.   On:  12/20/2020 05:30    Procedures Procedures   Medications Ordered in ED Medications - No data to display  ED Course  I have reviewed the triage vital signs and the nursing notes.  Pertinent labs & imaging results that were available during my care of the patient were reviewed by me and considered in my medical decision making (see chart for details).  MDM Rules/Calculators/A&P Right hip pain and numbness which appears to be secondary to sciatica.  Hip exam is normal including normal range of motion.  However, will check x-ray.  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  X-rays show mild degenerative changes but no acute process.  He is discharged with a prescription for orphenadrine, told to continue using acetaminophen as needed for pain.  Follow-up with PCP.  Final Clinical Impression(s) / ED Diagnoses Final diagnoses:  Right sided sciatica    Rx / DC Orders ED Discharge Orders         Ordered    orphenadrine (NORFLEX) 100 MG tablet  2 times daily        12/20/20 0615           Dione Booze, MD 12/20/20 [phone]

## 2020-12-20 NOTE — ED Triage Notes (Signed)
Pt reports he has been on his feet all day at work and his right hip/leg is causing him more pain than normal.  No other complains at this time.

## 2020-12-20 NOTE — ED Notes (Signed)
Patient transported to X-ray 

## 2020-12-23 ENCOUNTER — Emergency Department (HOSPITAL_COMMUNITY)
Admission: EM | Admit: 2020-12-23 | Discharge: 2020-12-23 | Disposition: A | Discharge status: Home / Self Care | Payer: Medicare Other | Attending: Emergency Medicine | Admitting: Emergency Medicine

## 2020-12-23 ENCOUNTER — Encounter (HOSPITAL_COMMUNITY): Payer: Self-pay

## 2020-12-23 ENCOUNTER — Other Ambulatory Visit: Payer: Self-pay

## 2020-12-23 ENCOUNTER — Emergency Department (HOSPITAL_COMMUNITY): Payer: Medicare Other

## 2020-12-23 DIAGNOSIS — F17210 Nicotine dependence, cigarettes, uncomplicated: Secondary | ICD-10-CM | POA: Diagnosis not present

## 2020-12-23 DIAGNOSIS — I10 Essential (primary) hypertension: Secondary | ICD-10-CM | POA: Diagnosis not present

## 2020-12-23 DIAGNOSIS — E1169 Type 2 diabetes mellitus with other specified complication: Secondary | ICD-10-CM | POA: Insufficient documentation

## 2020-12-23 DIAGNOSIS — M545 Low back pain, unspecified: Secondary | ICD-10-CM | POA: Diagnosis present

## 2020-12-23 DIAGNOSIS — M5431 Sciatica, right side: Secondary | ICD-10-CM

## 2020-12-23 DIAGNOSIS — E7800 Pure hypercholesterolemia, unspecified: Secondary | ICD-10-CM | POA: Insufficient documentation

## 2020-12-23 DIAGNOSIS — M5441 Lumbago with sciatica, right side: Secondary | ICD-10-CM | POA: Insufficient documentation

## 2020-12-23 MED ORDER — LIDOCAINE 5 % EX PTCH: 1.0000 | MEDICATED_PATCH | CUTANEOUS | 0 refills | Status: DC

## 2020-12-23 MED ORDER — PREDNISONE 50 MG PO TABS: 50.0000 mg | ORAL_TABLET | Freq: Every day | ORAL | 0 refills | Status: DC

## 2020-12-23 MED ORDER — HYDROCODONE-ACETAMINOPHEN 5-325 MG PO TABS
1.0000 | ORAL_TABLET | Freq: Once | ORAL | Status: AC
  Administered 2020-12-23: 1 via ORAL
  Filled 2020-12-23: qty 1

## 2020-12-23 MED ORDER — HYDROCODONE-ACETAMINOPHEN 5-325 MG PO TABS
1.0000 | ORAL_TABLET | ORAL | 0 refills | Status: DC | PRN
Start: 2020-12-23 — End: 2021-07-05

## 2020-12-23 NOTE — Discharge Instructions (Addendum)
Take the medication as prescribed.  Follow up with Primary care provider if symptoms unresolved  Return for new or worsening symptoms

## 2020-12-23 NOTE — ED Notes (Signed)
PA at bedside.

## 2020-12-23 NOTE — ED Notes (Signed)
Pt ambulated from triage to room. Gait steady with no assistance

## 2020-12-23 NOTE — ED Triage Notes (Signed)
Pt complains of right hip pain that radiates through his groin and down his leg, no injury noted

## 2020-12-23 NOTE — ED Provider Notes (Signed)
[facility]-EMERGENCY DEPT Provider Note   CSN: [id number] Arrival date & time: 12/23/20  0036    History Hip pain   Warren Gray is a 65 y.o. male with past medical history significant for DM, HTN who presents for evaluation of right hip pain/ low back pain, Radiates into medial thigh and down into right leg. No new injury or trauma. Seen 3 days ago with similar complaint. Xray right hip without acute process. Dc home with muscle relaxer.  Patient states muscle relaxers have not helped.  Ibuprofen helps more with his pain.  Patient states symptoms are intermittent in nature.  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  Patient states if he rests, lays down his pain improves.  Did have an MRI in 2018 which did show right nerve impingement.  He did not follow-up with neurosurgery.  He has not had no prior lumbar surgeries.  He states he intermittently gets tingling to the medial aspect of his right thigh however does not go into his groin.  Denies any bowel or bladder incontinence, saddle paresthesia, history of malignancy or IV drug use.  Rates his current pain a 7/10. Recently moved from [place]. denies fever, chills, nausea, vomiting, chest pain, shortness of breath, abdominal pain, testicular pain, difficulty with urination, hematuria, weakness, difficulty with ambulation.  Denies additional aggravating or alleviating factors.  History obtained from patient and past medical records.  No interpreter used  HPI     Past Medical History:  Diagnosis Date   Diabetes mellitus without complication (HCC)    High cholesterol    Hypertension     There are no problems to display for this patient.   History reviewed. No pertinent surgical history.     History reviewed. No pertinent family history.  Social History   Tobacco Use   Smoking status: Current Every Day Smoker    Packs/day: 1.00    Types: Cigarettes   Smokeless  tobacco: Never Used  Vaping Use   Vaping Use: Never used  Substance Use Topics   Alcohol use: Yes    Comment: "Sometimes"   Drug use: No    Home Medications Prior to Admission medications   Medication Sig Start Date End Date Taking? Authorizing Provider  HYDROcodone-acetaminophen (NORCO/VICODIN) 5-325 MG tablet Take 1 tablet by mouth every 4 (four) hours as needed. 12/23/20  Yes Deniah Saia A, PA-C  lidocaine (LIDODERM) 5 % Place 1 patch onto the skin daily. Remove & Discard patch within 12 hours or as directed by MD 12/23/20  Yes Evany Schecter A, PA-C  predniSONE (DELTASONE) 50 MG tablet Take 1 tablet (50 mg total) by mouth daily. 12/23/20  Yes Caymen Dubray A, PA-C  orphenadrine (NORFLEX) 100 MG tablet Take 1 tablet (100 mg total) by mouth 2 (two) times daily. 12/20/20   Dione Booze, MD  OVER THE COUNTER MEDICATION Take 1 tablet by mouth in the morning and at bedtime. Heartburn medication from [facility]    [provider]    Allergies    Naproxen, Sertraline, and Zoloft [sertraline hcl]  Review of Systems   Review of Systems  Constitutional: Negative.   HENT: Negative.   Respiratory: Negative.   Cardiovascular: Negative.   Gastrointestinal: Negative.   Genitourinary: Negative.   Musculoskeletal: Positive for back pain. Negative for gait problem.  Skin: Negative.   Neurological: Negative.   All other systems reviewed and are negative.   Physical Exam Updated  Vital Signs BP (!) 158/92    Pulse 68    Temp 98.2 F (36.8 C) (Oral)    Resp 17    Ht 5\' 7"  (1.702 m)    Wt 72.6 kg    SpO2 100%    BMI 25.06 kg/m   Physical Exam Physical Exam  Constitutional: Pt appears well-developed and well-nourished. No distress.  HENT:  Head: Normocephalic and atraumatic.  Mouth/Throat: Oropharynx is clear and moist. No oropharyngeal exudate.  Eyes: Conjunctivae are normal.  Neck: Normal range of motion. Neck supple.  Full ROM without pain  Cardiovascular: Normal rate,  regular rhythm and intact distal pulses.   Pulmonary/Chest: Effort normal and breath sounds normal. No respiratory distress. Pt has no wheezes.  Abdominal: Soft. Pt exhibits no distension. There is no tenderness, rebound or guarding. No abd bruit or pulsatile mass Musculoskeletal:  Full range of motion of the T-spine and L-spine with flexion, hyperextension, and lateral flexion. No midline tenderness or stepoffs. No tenderness to palpation of the spinous processes of the T-spine or L-spine. Tenderness to palpation of the paraspinous muscles of the L-spine on RIGHT. Positive straight leg raise at 35'. Tenderness over right piriformis and SI Lymphadenopathy:    Pt has no cervical adenopathy.  Neurological: Pt is alert. Pt has normal reflexes.  Speech is clear and goal oriented, follows commands Normal 5/5 strength in upper and lower extremities bilaterally including dorsiflexion and plantar flexion, strong and equal grip strength Sensation normal to light and sharp touch Moves extremities without ataxia, coordination intact Normal gait Normal balance No Clonus Skin: Skin is warm and dry. No rash noted or lesions noted. Pt is not diaphoretic. No erythema, ecchymosis,edema or warmth.  Intact sensation to BL lower extremities. No paresthesias. Psychiatric: Pt has a normal mood and affect. Behavior is normal.  Nursing note and vitals reviewed. ED Results / Procedures / Treatments   Labs (all labs ordered are listed, but only abnormal results are displayed) Labs Reviewed - No data to display  EKG None  Radiology DG Lumbar Spine Complete  Result Date: 12/23/2020 CLINICAL DATA:  Back pain, no known injury, low back pain radiating down right hip and leg EXAM: LUMBAR SPINE - COMPLETE 4+ VIEW COMPARISON:  None. FINDINGS: Five normally formed lumbar levels. Mild dextrocurvature of the spine apex L3. No acute fracture or vertebral body height loss. No significant spondylolisthesis or spondylolysis  is evident. Diffuse multilevel intervertebral disc height loss with discogenic and facet degenerative changes, features most pronounced L4-S1. Bones of the pelvis appear intact and congruent. Degenerative changes at the SI joints, left greater than right. Additional mild degenerative features of the bilateral hips. Atherosclerotic calcifications within the abdominal aorta and iliacs. No visible aneurysm. IMPRESSION: 1. Diffuse multilevel degenerative changes, most pronounced L4-S1. 2. Mild dextrocurvature of the spine, apex L3. Electronically Signed   By: 12/25/2020 M.D.   On: 12/23/2020 01:39    Procedures Procedures   Medications Ordered in ED Medications  HYDROcodone-acetaminophen (NORCO/VICODIN) 5-325 MG per tablet 1 tablet (1 tablet Oral Given 12/23/20 0129)    ED Course  I have reviewed the triage vital signs and the nursing notes.  Pertinent labs & imaging results that were available during my care of the patient were reviewed by me and considered in my medical decision making (see chart for details).  Patient here with intermittent back pain worsening with movement and standing for prolonged periods of time. Afebrile, non septic non ill appearing. No history of IV drug use,  bowel or bladder incontinence, saddle paresthesia, malignancy. He is neurovascularly intact. Does have palpable bilateral paraspinal muscle spasms as well as tenderness more so to right lumbar paraspinal region, right SI and piriformis. He has positive straight leg raise on the right. X-ray lumbar here shows some degenerative changes. His heart and lungs are clear. His abdomen is soft, nontender. He is ambulatory without difficulty. Symptoms consistent with sciatica. Will treat as such. Urged follow-up with PCP if his symptoms are unresolved. At this time I have low suspicion for acute neurosurgical emergency such as cauda equina, discitis, osteomyelitis, transverse myelitis, psoas abscess, acute traumatic injury, acute  intra-abdominal process, VTE, ischemic limb.  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  The patient has been appropriately medically screened and/or stabilized in the ED. I have low suspicion for any other emergent medical condition which would require further screening, evaluation or treatment in the ED or require inpatient management.  Patient is hemodynamically stable and in no acute distress.  Patient able to ambulate in department prior to ED.  Evaluation does not show acute pathology that would require ongoing or additional emergent interventions while in the emergency department or further inpatient treatment.  I have discussed the diagnosis with the patient and answered all questions.  Pain is been managed while in the emergency department and patient has no further complaints prior to discharge.  Patient is comfortable with plan discussed in room and is stable for discharge at this time.  I have discussed strict return precautions for returning to the emergency department.  Patient was encouraged to follow-up with PCP/specialist refer to at discharge.    MDM Rules/Calculators/A&P                           Final Clinical Impression(s) / ED Diagnoses Final diagnoses:  Sciatica of right side    Rx / DC Orders ED Discharge Orders         Ordered    HYDROcodone-acetaminophen (NORCO/VICODIN) 5-325 MG tablet  Every 4 hours PRN        12/23/20 0208    lidocaine (LIDODERM) 5 %  Every 24 hours        12/23/20 0208    predniSONE (DELTASONE) 50 MG tablet  Daily        12/23/20 0208           Alfredia Desanctis A, PA-C 12/23/20 0214    Shon Baton, MD 12/23/20 [phone]

## 2020-12-24 ENCOUNTER — Encounter (HOSPITAL_COMMUNITY): Payer: Self-pay | Admitting: *Deleted

## 2020-12-24 ENCOUNTER — Emergency Department (HOSPITAL_COMMUNITY): Payer: Medicare Other

## 2020-12-24 ENCOUNTER — Emergency Department (HOSPITAL_COMMUNITY)
Admission: EM | Admit: 2020-12-24 | Discharge: 2020-12-24 | Disposition: A | Discharge status: Home / Self Care | Payer: Medicare Other | Attending: Emergency Medicine | Admitting: Emergency Medicine

## 2020-12-24 ENCOUNTER — Other Ambulatory Visit: Payer: Self-pay

## 2020-12-24 DIAGNOSIS — E119 Type 2 diabetes mellitus without complications: Secondary | ICD-10-CM | POA: Insufficient documentation

## 2020-12-24 DIAGNOSIS — R1030 Lower abdominal pain, unspecified: Secondary | ICD-10-CM | POA: Insufficient documentation

## 2020-12-24 DIAGNOSIS — R109 Unspecified abdominal pain: Secondary | ICD-10-CM

## 2020-12-24 DIAGNOSIS — F17210 Nicotine dependence, cigarettes, uncomplicated: Secondary | ICD-10-CM | POA: Diagnosis not present

## 2020-12-24 DIAGNOSIS — I10 Essential (primary) hypertension: Secondary | ICD-10-CM | POA: Insufficient documentation

## 2020-12-24 LAB — COMPREHENSIVE METABOLIC PANEL
ALT: 34 U/L (ref 0–44)
AST: 26 U/L (ref 15–41)
Albumin: 3.9 g/dL (ref 3.5–5.0)
Alkaline Phosphatase: 81 U/L (ref 38–126)
Anion gap: 11 (ref 5–15)
BUN: 17 mg/dL (ref 8–23)
CO2: 22 mmol/L (ref 22–32)
Calcium: 9.4 mg/dL (ref 8.9–10.3)
Chloride: 104 mmol/L (ref 98–111)
Creatinine, Ser: 0.88 mg/dL (ref 0.61–1.24)
GFR, Estimated: >60 mL/min (ref >60)
Glucose, Bld: 129 mg/dL — ABNORMAL HIGH (ref 70–99)
Potassium: 3.9 mmol/L (ref 3.5–5.1)
Sodium: 137 mmol/L (ref 135–145)
Total Bilirubin: 0.4 mg/dL (ref 0.3–1.2)
Total Protein: 7 g/dL (ref 6.5–8.1)

## 2020-12-24 LAB — CBC
HCT: 45.1 % (ref 39.0–52.0)
Hemoglobin: 14.6 g/dL (ref 13.0–17.0)
MCH: 31.7 pg (ref 26.0–34.0)
MCHC: 32.4 g/dL (ref 30.0–36.0)
MCV: 97.8 fL (ref 80.0–100.0)
Platelets: 215 10*3/uL (ref 150–400)
RBC: 4.61 MIL/uL (ref 4.22–5.81)
RDW: 12.9 % (ref 11.5–15.5)
WBC: 8.5 10*3/uL (ref 4.0–10.5)
nRBC: 0 % (ref 0.0–0.2)

## 2020-12-24 LAB — LIPASE, BLOOD: Lipase: 70 U/L — ABNORMAL HIGH (ref 11–51)

## 2020-12-24 MED ORDER — LIDOCAINE VISCOUS HCL 2 % MT SOLN
15.0000 mL | Freq: Once | OROMUCOSAL | Status: AC
  Administered 2020-12-24: 15 mL via ORAL
  Filled 2020-12-24: qty 15

## 2020-12-24 MED ORDER — ALUM & MAG HYDROXIDE-SIMETH 200-200-20 MG/5ML PO SUSP
30.0000 mL | Freq: Once | ORAL | Status: AC
  Administered 2020-12-24: 30 mL via ORAL
  Filled 2020-12-24: qty 30

## 2020-12-24 NOTE — ED Provider Notes (Signed)
[facility] EMERGENCY DEPARTMENT Provider Note  CSN: [id number] Arrival date & time: 12/24/20 0017  Chief Complaint(s) Abdominal Pain  HPI Warren Gray is a 65 y.o. male   The history is provided by the patient.  Abdominal Pain Pain location:  Periumbilical and suprapubic Pain quality: aching and cramping   Pain radiates to:  Does not radiate Pain severity:  Moderate Onset quality:  Gradual Duration:  2 days Timing:  Intermittent Progression:  Waxing and waning Chronicity:  Recurrent Context: not sick contacts and not suspicious food intake   Relieved by:  Nothing Worsened by:  Nothing Associated symptoms: no chills, no constipation, no cough, no diarrhea, no dysuria, no fever, no melena, no nausea and no vomiting     Past Medical History Past Medical History:  Diagnosis Date  . Diabetes mellitus without complication (HCC)   . High cholesterol   . Hypertension    There are no problems to display for this patient.  Home Medication(s) Prior to Admission medications   Medication Sig Start Date End Date Taking? Authorizing Provider  HYDROcodone-acetaminophen (NORCO/VICODIN) 5-325 MG tablet Take 1 tablet by mouth every 4 (four) hours as needed. 12/23/20   Henderly, Britni A, PA-C  lidocaine (LIDODERM) 5 % Place 1 patch onto the skin daily. Remove & Discard patch within 12 hours or as directed by MD 12/23/20   Henderly, Britni A, PA-C  orphenadrine (NORFLEX) 100 MG tablet Take 1 tablet (100 mg total) by mouth 2 (two) times daily. 12/20/20   Dione Booze, MD  OVER THE COUNTER MEDICATION Take 1 tablet by mouth in the morning and at bedtime. Heartburn medication from [facility]    [provider]  predniSONE (DELTASONE) 50 MG tablet Take 1 tablet (50 mg total) by mouth daily. 12/23/20   Henderly, Britni A, PA-C                                                                                                                                    Past Surgical  History History reviewed. No pertinent surgical history. Family History No family history on file.  Social History Social History   Tobacco Use  . Smoking status: Current Every Day Smoker    Packs/day: 1.00    Types: Cigarettes  . Smokeless tobacco: Never Used  Vaping Use  . Vaping Use: Never used  Substance Use Topics  . Alcohol use: Yes    Comment: "Sometimes"  . Drug use: No   Allergies Naproxen, Sertraline, and Zoloft [sertraline hcl]  Review of Systems Review of Systems  Constitutional: Negative for chills and fever.  Respiratory: Negative for cough.   Gastrointestinal: Positive for abdominal pain. Negative for constipation, diarrhea, melena, nausea and vomiting.  Genitourinary: Negative for dysuria.   All other systems are reviewed and are negative for acute change except as noted in the HPI  Physical Exam Vital Signs  I have reviewed the triage vital  signs BP 136/78 (BP Location: Right Arm)   Pulse 60   Temp 98.3 F (36.8 C) (Oral)   Resp 16   SpO2 97%   Physical Exam Vitals reviewed.  Constitutional:      General: He is not in acute distress.    Appearance: He is well-developed and well-nourished. He is not diaphoretic.  HENT:     Head: Normocephalic and atraumatic.     Jaw: No trismus.     Right Ear: External ear normal.     Left Ear: External ear normal.     Nose: Nose normal.     Mouth/Throat:     Mouth: Mucous membranes are normal.  Eyes:     General: No scleral icterus.    Extraocular Movements: EOM normal.     Conjunctiva/sclera: Conjunctivae normal.  Neck:     Trachea: Phonation normal.  Cardiovascular:     Rate and Rhythm: Normal rate and regular rhythm.  Pulmonary:     Effort: Pulmonary effort is normal. No respiratory distress.     Breath sounds: No stridor.  Abdominal:     General: There is no distension.     Tenderness: There is no abdominal tenderness. There is no guarding or rebound.  Musculoskeletal:        General: No  edema. Normal range of motion.     Cervical back: Normal range of motion.  Neurological:     Mental Status: He is alert and oriented to person, place, and time.  Psychiatric:        Mood and Affect: Mood and affect normal.        Behavior: Behavior normal.     ED Results and Treatments Labs (all labs ordered are listed, but only abnormal results are displayed) Labs Reviewed  LIPASE, BLOOD - Abnormal; Notable for the following components:      Result Value   Lipase 70 (*)    All other components within normal limits  COMPREHENSIVE METABOLIC PANEL - Abnormal; Notable for the following components:   Glucose, Bld 129 (*)    All other components within normal limits  CBC  URINALYSIS, ROUTINE W REFLEX MICROSCOPIC                                                                                                                         EKG  EKG Interpretation  Date/Time:    Ventricular Rate:    PR Interval:    QRS Duration:   QT Interval:    QTC Calculation:   R Axis:     Text Interpretation:        Radiology DG ABD ACUTE 2+V W 1V CHEST  Result Date: 12/24/2020 CLINICAL DATA:  65 year old male with stomach cramps for 3-4 hours. Smoker. EXAM: DG ABDOMEN ACUTE WITH 1 VIEW CHEST COMPARISON:  CT Abdomen and Pelvis 04/26/2018. FINDINGS: PA chest. Lung volumes and mediastinal contours are within normal limits. Visualized tracheal air column is within normal limits. There is coarse bilateral  pulmonary interstitial opacity, fairly symmetric. No pneumothorax or pneumoperitoneum. No confluent pulmonary opacity. Non obstructed bowel gas pattern. Abdominal and pelvic visceral contours appear stable since 2019. Incidental pelvic phleboliths. No acute osseous abnormality identified. Chronic degenerative changes at the shoulders, in the spine. IMPRESSION: 1. Normal bowel gas pattern, no free air. 2. Coarse pulmonary interstitial opacity, favor due to chronic smoking. Electronically Signed   By: Odessa FlemingH   Hall M.D.   On: 12/24/2020 04:14    Pertinent labs & imaging results that were available during my care of the patient were reviewed by me and considered in my medical decision making (see chart for details).  Medications Ordered in ED Medications  alum & mag hydroxide-simeth (MAALOX/MYLANTA) 200-200-20 MG/5ML suspension 30 mL (30 mLs Oral Given 12/24/20 0407)    And  lidocaine (XYLOCAINE) 2 % viscous mouth solution 15 mL (15 mLs Oral Given 12/24/20 0407)                                                                                                                                    Procedures Procedures  (including critical care time)  Medical Decision Making / ED Course I have reviewed the nursing notes for this encounter and the patient's prior records (if available in EHR or on provided paperwork).   Elmarie ShileyClyde Arnone was evaluated in Emergency Department on 12/24/2020 for the symptoms described in the history of present illness. He was evaluated in the context of the global COVID-19 pandemic, which necessitated consideration that the patient might be at risk for infection with the SARS-CoV-2 virus that causes COVID-19. Institutional protocols and algorithms that pertain to the evaluation of patients at risk for COVID-19 are in a state of rapid change based on information released by regulatory bodies including the CDC and federal and state organizations. These policies and algorithms were followed during the patient's care in the ED.  Lower abd discomfort. No TTP. Benign exam. Labs reassuring w/o leukocytosis. No significant electrolyte derangements or renal sufficiency. No evidence of DKA. KUB without evidence of obstruction. Tolerating oral intake. Pain improved with GI cocktail.      Final Clinical Impression(s) / ED Diagnoses Final diagnoses:  Abdominal cramping  Abdominal discomfort    The patient appears reasonably screened and/or stabilized for discharge and I doubt any  other medical condition or other [facility] requiring further screening, evaluation, or treatment in the ED at this time prior to discharge. Safe for discharge with strict return precautions.  Disposition: Discharge  Condition: Good  I have discussed the results, Dx and Tx plan with the patient/family who expressed understanding and agree(s) with the plan. Discharge instructions discussed at length. The patient/family was given strict return precautions who verbalized understanding of the instructions. No further questions at time of discharge.    ED Discharge Orders    None      Follow Up: Jeannine BogaAkbari, Marjaneh, MD [address] [place] [id number]  [phone]  Call  to schedule an appointment for close follow up     This chart was dictated using voice recognition software.  Despite best efforts to proofread,  errors can occur which can change the documentation meaning.   Nira Conn, MD 12/24/20 [phone]

## 2020-12-24 NOTE — ED Notes (Signed)
Pt transported to Xray via stretcher in stable condition

## 2020-12-24 NOTE — ED Triage Notes (Signed)
Pt reports stomach cramps x3-4 hours.  Pt reports LBM ws 2 days ago.  No fever, no n/v with this.

## 2020-12-24 NOTE — ED Notes (Signed)
MD at bedside. 

## 2021-03-11 ENCOUNTER — Other Ambulatory Visit: Payer: Self-pay

## 2021-03-11 ENCOUNTER — Emergency Department (HOSPITAL_COMMUNITY): Payer: Medicare Other

## 2021-03-11 ENCOUNTER — Encounter (HOSPITAL_COMMUNITY): Payer: Self-pay

## 2021-03-11 ENCOUNTER — Emergency Department (HOSPITAL_COMMUNITY)
Admission: EM | Admit: 2021-03-11 | Discharge: 2021-03-12 | Disposition: A | Discharge status: Home / Self Care | Payer: Medicare Other | Attending: Emergency Medicine | Admitting: Emergency Medicine

## 2021-03-11 DIAGNOSIS — S0990XA Unspecified injury of head, initial encounter: Secondary | ICD-10-CM

## 2021-03-11 DIAGNOSIS — F17210 Nicotine dependence, cigarettes, uncomplicated: Secondary | ICD-10-CM | POA: Insufficient documentation

## 2021-03-11 DIAGNOSIS — M25551 Pain in right hip: Secondary | ICD-10-CM | POA: Diagnosis not present

## 2021-03-11 DIAGNOSIS — I10 Essential (primary) hypertension: Secondary | ICD-10-CM | POA: Insufficient documentation

## 2021-03-11 DIAGNOSIS — W01198A Fall on same level from slipping, tripping and stumbling with subsequent striking against other object, initial encounter: Secondary | ICD-10-CM | POA: Insufficient documentation

## 2021-03-11 DIAGNOSIS — S0001XA Abrasion of scalp, initial encounter: Secondary | ICD-10-CM | POA: Insufficient documentation

## 2021-03-11 DIAGNOSIS — Y92480 Sidewalk as the place of occurrence of the external cause: Secondary | ICD-10-CM | POA: Insufficient documentation

## 2021-03-11 DIAGNOSIS — E119 Type 2 diabetes mellitus without complications: Secondary | ICD-10-CM | POA: Diagnosis not present

## 2021-03-11 DIAGNOSIS — M79642 Pain in left hand: Secondary | ICD-10-CM | POA: Diagnosis not present

## 2021-03-11 DIAGNOSIS — W19XXXA Unspecified fall, initial encounter: Secondary | ICD-10-CM

## 2021-03-11 LAB — COMPREHENSIVE METABOLIC PANEL
ALT: 35 U/L (ref 0–44)
AST: 27 U/L (ref 15–41)
Albumin: 3.9 g/dL (ref 3.5–5.0)
Alkaline Phosphatase: 65 U/L (ref 38–126)
Anion gap: 9 (ref 5–15)
BUN: 16 mg/dL (ref 8–23)
CO2: 21 mmol/L — ABNORMAL LOW (ref 22–32)
Calcium: 9.3 mg/dL (ref 8.9–10.3)
Chloride: 107 mmol/L (ref 98–111)
Creatinine, Ser: 0.89 mg/dL (ref 0.61–1.24)
GFR, Estimated: >60 mL/min (ref >60)
Glucose, Bld: 187 mg/dL — ABNORMAL HIGH (ref 70–99)
Potassium: 3.6 mmol/L (ref 3.5–5.1)
Sodium: 137 mmol/L (ref 135–145)
Total Bilirubin: 0.7 mg/dL (ref 0.3–1.2)
Total Protein: 7 g/dL (ref 6.5–8.1)

## 2021-03-11 LAB — CBC WITH DIFFERENTIAL/PLATELET
Abs Immature Granulocytes: 0.03 10*3/uL (ref 0.00–0.07)
Basophils Absolute: 0.1 10*3/uL (ref 0.0–0.1)
Basophils Relative: 1 %
Eosinophils Absolute: 0.2 10*3/uL (ref 0.0–0.5)
Eosinophils Relative: 3 %
HCT: 44.2 % (ref 39.0–52.0)
Hemoglobin: 14.6 g/dL (ref 13.0–17.0)
Immature Granulocytes: 0 %
Lymphocytes Relative: 42 %
Lymphs Abs: 3.1 10*3/uL (ref 0.7–4.0)
MCH: 32.4 pg (ref 26.0–34.0)
MCHC: 33 g/dL (ref 30.0–36.0)
MCV: 98.2 fL (ref 80.0–100.0)
Monocytes Absolute: 0.4 10*3/uL (ref 0.1–1.0)
Monocytes Relative: 6 %
Neutro Abs: 3.5 10*3/uL (ref 1.7–7.7)
Neutrophils Relative %: 48 %
Platelets: 201 10*3/uL (ref 150–400)
RBC: 4.5 MIL/uL (ref 4.22–5.81)
RDW: 13 % (ref 11.5–15.5)
WBC: 7.3 10*3/uL (ref 4.0–10.5)
nRBC: 0 % (ref 0.0–0.2)
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
## 2021-03-12 NOTE — ED Provider Notes (Signed)
[facility] EMERGENCY DEPARTMENT Provider Note   CSN: [id number] Arrival date & time: 03/11/21  1626     History Chief Complaint  Patient presents with  . Fall    Warren Gray is a 65 y.o. male.  Patient presents to the emergency department for evaluation after a fall.  Patient reports that he tripped getting off of a bus and fell, hitting the top of his head.  He thinks he did get knocked out for 1 or 2 minutes, but bystanders report that he did not lose consciousness.  Patient reports mild headache.  He initially reported and pain and hip pain but reports that this has improved since he got here.        Past Medical History:  Diagnosis Date  . Diabetes mellitus without complication (HCC)   . High cholesterol   . Hypertension     There are no problems to display for this patient.   History reviewed. No pertinent surgical history.     History reviewed. No pertinent family history.  Social History   Tobacco Use  . Smoking status: Current Every Day Smoker    Packs/day: 1.00    Types: Cigarettes  . Smokeless tobacco: Never Used  Vaping Use  . Vaping Use: Never used  Substance Use Topics  . Alcohol use: Yes    Comment: "Sometimes"  . Drug use: No    Home Medications Prior to Admission medications   Medication Sig Start Date End Date Taking? Authorizing Provider  HYDROcodone-acetaminophen (NORCO/VICODIN) 5-325 MG tablet Take 1 tablet by mouth every 4 (four) hours as needed. 12/23/20   Henderly, Britni A, PA-C  lidocaine (LIDODERM) 5 % Place 1 patch onto the skin daily. Remove & Discard patch within 12 hours or as directed by MD 12/23/20   Henderly, Britni A, PA-C  orphenadrine (NORFLEX) 100 MG tablet Take 1 tablet (100 mg total) by mouth 2 (two) times daily. 12/20/20   Dione Booze, MD  OVER THE COUNTER MEDICATION Take 1 tablet by mouth in the morning and at bedtime. Heartburn medication from [facility]    [provider]  predniSONE  (DELTASONE) 50 MG tablet Take 1 tablet (50 mg total) by mouth daily. 12/23/20   Henderly, Britni A, PA-C    Allergies    Naproxen, Sertraline, and Zoloft [sertraline hcl]  Review of Systems   Review of Systems  Neurological: Positive for headaches.  All other systems reviewed and are negative.   Physical Exam Updated Vital Signs BP 127/76 (BP Location: Left Arm)   Pulse 80   Temp 99 F (37.2 C) (Oral)   Resp 16   SpO2 99%   Physical Exam Vitals and nursing note reviewed.  Constitutional:      General: He is not in acute distress.    Appearance: Normal appearance. He is well-developed.  HENT:     Head: Normocephalic.     Comments: Abrasion vertex scalp    Right Ear: Hearing normal.     Left Ear: Hearing normal.     Nose: Nose normal.  Eyes:     Conjunctiva/sclera: Conjunctivae normal.     Pupils: Pupils are equal, round, and reactive to light.  Cardiovascular:     Rate and Rhythm: Regular rhythm.     Heart sounds: S1 normal and S2 normal. No murmur heard. No friction rub. No gallop.   Pulmonary:     Effort: Pulmonary effort is normal. No respiratory distress.     Breath sounds: Normal breath  sounds.  Chest:     Chest wall: No tenderness.  Abdominal:     General: Bowel sounds are normal.     Palpations: Abdomen is soft.     Tenderness: There is no abdominal tenderness. There is no guarding or rebound. Negative signs include Murphy's sign and McBurney's sign.     Hernia: No hernia is present.  Musculoskeletal:        General: Normal range of motion.     Right hand: No swelling or deformity. Normal range of motion.     Left hand: Tenderness present. No swelling or deformity. Normal range of motion.     Cervical back: Normal range of motion and neck supple.     Right hip: No tenderness. Normal range of motion.     Left hip: No tenderness. Normal range of motion.  Skin:    General: Skin is warm and dry.     Findings: No rash.  Neurological:     Mental Status: He  is alert and oriented to person, place, and time.     GCS: GCS eye subscore is 4. GCS verbal subscore is 5. GCS motor subscore is 6.     Cranial Nerves: No cranial nerve deficit.     Sensory: No sensory deficit.     Coordination: Coordination normal.  Psychiatric:        Speech: Speech normal.        Behavior: Behavior normal.        Thought Content: Thought content normal.     ED Results / Procedures / Treatments   Labs (all labs ordered are listed, but only abnormal results are displayed) Labs Reviewed  COMPREHENSIVE METABOLIC PANEL - Abnormal; Notable for the following components:      Result Value   CO2 21 (*)    Glucose, Bld 187 (*)    All other components within normal limits  CBC WITH DIFFERENTIAL/PLATELET    EKG None  Radiology DG Elbow Complete Right  Result Date: 03/11/2021 CLINICAL DATA:  Pain following fall EXAM: RIGHT ELBOW - COMPLETE 3+ VIEW COMPARISON:  None. FINDINGS: Frontal, lateral, and bilateral oblique views were obtained. There is no appreciable fracture or dislocation. No joint effusion. No appreciable joint space narrowing. No erosions. IMPRESSION: No fracture or dislocation.  No appreciable arthropathic change. Electronically Signed   By: Bretta Bang III M.D.   On: 03/11/2021 17:35   CT Head Wo Contrast  Result Date: 03/11/2021 CLINICAL DATA:  Fall with loss of consciousness. EXAM: CT HEAD WITHOUT CONTRAST CT CERVICAL SPINE WITHOUT CONTRAST TECHNIQUE: Multidetector CT imaging of the head and cervical spine was performed following the standard protocol without intravenous contrast. Multiplanar CT image reconstructions of the cervical spine were also generated. COMPARISON:  None. FINDINGS: CT HEAD FINDINGS Brain: No evidence of acute infarction, hemorrhage, hydrocephalus, extra-axial collection or mass lesion/mass effect. Vascular: Atherosclerotic vascular calcification of the carotid siphons. No hyperdense vessel. Skull: Normal. Negative for fracture or  focal lesion. Sinuses/Orbits: No acute finding. Other: None. CT CERVICAL SPINE FINDINGS Alignment: No traumatic malalignment. Skull base and vertebrae: No acute fracture. No primary bone lesion or focal pathologic process. Soft tissues and spinal canal: No prevertebral fluid or swelling. No visible canal hematoma. Disc levels: Multilevel disc height loss and uncovertebral hypertrophy, moderate at C5-C6 and C6-C7. Upper chest: Mild centrilobular emphysema. Other: None. IMPRESSION: 1. No acute intracranial abnormality. 2. No acute cervical spine fracture or traumatic malalignment. 3. Emphysema (ICD10-J43.9). Electronically Signed   By: Obie Dredge  M.D.   On: 03/11/2021 18:12   CT Cervical Spine Wo Contrast  Result Date: 03/11/2021 CLINICAL DATA:  Fall with loss of consciousness. EXAM: CT HEAD WITHOUT CONTRAST CT CERVICAL SPINE WITHOUT CONTRAST TECHNIQUE: Multidetector CT imaging of the head and cervical spine was performed following the standard protocol without intravenous contrast. Multiplanar CT image reconstructions of the cervical spine were also generated. COMPARISON:  None. FINDINGS: CT HEAD FINDINGS Brain: No evidence of acute infarction, hemorrhage, hydrocephalus, extra-axial collection or mass lesion/mass effect. Vascular: Atherosclerotic vascular calcification of the carotid siphons. No hyperdense vessel. Skull: Normal. Negative for fracture or focal lesion. Sinuses/Orbits: No acute finding. Other: None. CT CERVICAL SPINE FINDINGS Alignment: No traumatic malalignment. Skull base and vertebrae: No acute fracture. No primary bone lesion or focal pathologic process. Soft tissues and spinal canal: No prevertebral fluid or swelling. No visible canal hematoma. Disc levels: Multilevel disc height loss and uncovertebral hypertrophy, moderate at C5-C6 and C6-C7. Upper chest: Mild centrilobular emphysema. Other: None. IMPRESSION: 1. No acute intracranial abnormality. 2. No acute cervical spine fracture or  traumatic malalignment. 3. Emphysema (ICD10-J43.9). Electronically Signed   By: Obie Dredge M.D.   On: 03/11/2021 18:12   DG Hand Complete Left  Result Date: 03/11/2021 CLINICAL DATA:  Pain following fall EXAM: LEFT HAND - COMPLETE 3+ VIEW COMPARISON:  None. FINDINGS: Frontal, oblique, and lateral views were obtained. There is no appreciable fracture or dislocation. Joint spaces appear normal. No erosive change. IMPRESSION: No fracture or dislocation.  No evident arthropathy. Electronically Signed   By: Bretta Bang III M.D.   On: 03/11/2021 17:38   DG Hip Unilat With Pelvis 2-3 Views Right  Result Date: 03/11/2021 CLINICAL DATA:  Pain following fall EXAM: DG HIP (WITH OR WITHOUT PELVIS) 2-3V RIGHT COMPARISON:  December 20, 2020 FINDINGS: Frontal pelvis as well as frontal and lateral right hip images were obtained. There is no fracture or dislocation. There is slight symmetric narrowing of each hip joint. 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 No erosions. IMPRESSION: Slight narrowing of each hip joint. 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 No fracture or dislocation. No erosions. Electronically Signed   By: Bretta Bang III M.D.   On: 03/11/2021 17:37    Procedures Procedures   Medications Ordered in ED Medications - No data to display  ED Course  I have reviewed the triage vital signs and the nursing notes.  Pertinent labs & imaging results that were available during my care of the patient were reviewed by me and considered in my medical decision making (see chart for details).    MDM Rules/Calculators/A&P                          Patient presents after a fall.  He is awake and alert currently.  CT head and cervical spine without abnormality.  Patient initially complained of left hand and right hip pain, reports that these areas have improved.  X-rays are negative.  No further intervention necessary.  Final Clinical  Impression(s) / ED Diagnoses Final diagnoses:  Minor head injury, initial encounter    Rx / DC Orders ED Discharge Orders    None       Hennessy Bartel, Canary Brim, MD 03/12/21 0104

## 2021-06-07 ENCOUNTER — Other Ambulatory Visit: Payer: Self-pay

## 2021-06-07 ENCOUNTER — Emergency Department (HOSPITAL_COMMUNITY)
Admission: EM | Admit: 2021-06-07 | Discharge: 2021-06-08 | Disposition: A | Discharge status: Home / Self Care | Payer: Medicare Other | Attending: Emergency Medicine | Admitting: Emergency Medicine

## 2021-06-07 DIAGNOSIS — I10 Essential (primary) hypertension: Secondary | ICD-10-CM | POA: Insufficient documentation

## 2021-06-07 DIAGNOSIS — M5440 Lumbago with sciatica, unspecified side: Secondary | ICD-10-CM | POA: Insufficient documentation

## 2021-06-07 DIAGNOSIS — E119 Type 2 diabetes mellitus without complications: Secondary | ICD-10-CM | POA: Diagnosis not present

## 2021-06-07 DIAGNOSIS — W268XXA Contact with other sharp object(s), not elsewhere classified, initial encounter: Secondary | ICD-10-CM | POA: Diagnosis not present

## 2021-06-07 DIAGNOSIS — F17210 Nicotine dependence, cigarettes, uncomplicated: Secondary | ICD-10-CM | POA: Diagnosis not present

## 2021-06-07 DIAGNOSIS — S6992XA Unspecified injury of left wrist, hand and finger(s), initial encounter: Secondary | ICD-10-CM | POA: Diagnosis present

## 2021-06-07 DIAGNOSIS — Y990 Civilian activity done for income or pay: Secondary | ICD-10-CM | POA: Diagnosis not present

## 2021-06-07 DIAGNOSIS — S61213A Laceration without foreign body of left middle finger without damage to nail, initial encounter: Secondary | ICD-10-CM | POA: Insufficient documentation

## 2021-06-07 NOTE — ED Triage Notes (Signed)
Pt states he cut L middle finger Saturday, worried for infection. Denies fever, chills, n/v. Also endorses intermittent mid-lower back, leg pain x1mos. States he does not have PCP for follow up

## 2021-06-07 NOTE — ED Provider Notes (Signed)
Emergency Medicine Provider Triage Evaluation Note  Warren Gray , a 65 y.o. male  was evaluated in triage.  Pt complains of left third finger wound. The patient was cutting cabbage at work on 8/4 when he sliced the tip of the finger. No evaluation for the wound prior to arrival. TDAP updated 2 years ago.   He also endorses constant low back pan for several months. Pain is not worse today. Pain will intermittently radiate into his right hip. No numbness, weakness, or incontinence, fever, chills. He is requesting referrals to get established with a PCP.   Review of Systems  Positive: Back pain, wound, arthralgias, myalgias  Negative: Fever, chills, incontinence, vomiting, rash, redness, warmth   Physical Exam  BP 132/78 (BP Location: Right Arm)   Pulse 83   Temp 98.3 F (36.8 C) (Oral)   Resp 18   SpO2 98%  Gen:   Awake, no distress   Resp:  Normal effort  MSK:   Moves extremities without difficulty  Other:    Sensation is intact to all 4 distal aspects of the left third finger.  Partial avulsion of the fingernail.  Good granuloma tissue formation.  There are some macerated skin noted to the distal half of the left third finger.  Good strength against resistance.  Good capillary refill.  Spine is nontender.  NVI to the bilateral lower extremities.  Medical Decision Making  Medically screening exam initiated at 10:53 PM.  Appropriate orders placed.  Rei Medlen was informed that the remainder of the evaluation will be completed by another provider, this initial triage assessment does not replace that evaluation, and the importance of remaining in the ED until their evaluation is complete.  Patient presenting with multiple complaints.  His primary complaint is a wound he sustained to the left third finger approximately 6 days ago.  Tdap is up-to-date.  He is neurovascular intact.  Doubt fracture as there is no focal tenderness.  He is also endorsing low back pain for the last few months.   Symptoms have not worsened, but are not improving.  He has been seen and evaluated for the same during previous ED visits.  Will order urinalysis.  He will require further work-up or evaluation in the ED.   Barkley Boards, PA-C 06/07/21 2253    Linwood Dibbles, MD 06/08/21 2020

## 2021-06-08 ENCOUNTER — Emergency Department (HOSPITAL_COMMUNITY): Payer: Medicare Other

## 2021-06-08 ENCOUNTER — Telehealth: Payer: Self-pay | Admitting: *Deleted

## 2021-06-08 DIAGNOSIS — S61213A Laceration without foreign body of left middle finger without damage to nail, initial encounter: Secondary | ICD-10-CM | POA: Diagnosis not present

## 2021-06-08 LAB — URINALYSIS, ROUTINE W REFLEX MICROSCOPIC
Bilirubin Urine: NEGATIVE
Glucose, UA: 50 mg/dL — AB
Hgb urine dipstick: NEGATIVE
Ketones, ur: NEGATIVE mg/dL
Leukocytes,Ua: NEGATIVE
Nitrite: NEGATIVE
Protein, ur: NEGATIVE mg/dL
Specific Gravity, Urine: 1.02 (ref 1.005–1.030)
pH: 5 (ref 5.0–8.0)

## 2021-06-08 MED ORDER — TRAMADOL HCL 50 MG PO TABS: ORAL_TABLET | ORAL | 0 refills | Status: DC

## 2021-06-08 NOTE — Telephone Encounter (Signed)
Pharmacy called related to Rx: tramadol directions...EDCM clarified with EDP ([place]) to change Rx to: take 1 tablet every 6 hours as needed for pain not helped by Tylenol.

## 2021-06-08 NOTE — Discharge Instructions (Addendum)
Follow-up with [facility] family doctor.  Clean finger twice a day with soap and water.  Return if any problems

## 2021-06-08 NOTE — ED Provider Notes (Signed)
[facility] EMERGENCY DEPARTMENT Provider Note   CSN: [id number] Arrival date & time: 06/07/21  2228     History Chief Complaint  Patient presents with   Finger Injury   Back Pain    Shain Pauwels is a 65 y.o. male.  Patient cut the end of his left middle finger while he was at work recently.  Patient also complains of some back pain  The history is provided by the patient and medical records. No language interpreter was used.  Back Pain Location:  Generalized Quality:  Aching Radiates to:  Does not radiate Pain severity:  Moderate Pain is:  Same all the time Onset quality:  Gradual Timing:  Constant Progression:  Waxing and waning Chronicity:  New Context: not emotional stress   Associated symptoms: no abdominal pain, no chest pain and no headaches       Past Medical History:  Diagnosis Date   Diabetes mellitus without complication (HCC)    High cholesterol    Hypertension     There are no problems to display for this patient.   No past surgical history on file.     No family history on file.  Social History   Tobacco Use   Smoking status: Every Day    Packs/day: 1.00    Types: Cigarettes   Smokeless tobacco: Never  Vaping Use   Vaping Use: Never used  Substance Use Topics   Alcohol use: Yes    Comment: "Sometimes"   Drug use: No    Home Medications Prior to Admission medications   Medication Sig Start Date End Date Taking? Authorizing Provider  traMADol (ULTRAM) 50 MG tablet Requiring every 3 pounds as needed for pain not helped by Tylenol 06/08/21  Yes Bethann Berkshire, MD  HYDROcodone-acetaminophen (NORCO/VICODIN) 5-325 MG tablet Take 1 tablet by mouth every 4 (four) hours as needed. 12/23/20   Henderly, Britni A, PA-C  lidocaine (LIDODERM) 5 % Place 1 patch onto the skin daily. Remove & Discard patch within 12 hours or as directed by MD 12/23/20   Henderly, Britni A, PA-C  orphenadrine (NORFLEX) 100 MG tablet Take 1 tablet (100  mg total) by mouth 2 (two) times daily. 12/20/20   Dione Booze, MD  OVER THE COUNTER MEDICATION Take 1 tablet by mouth in the morning and at bedtime. Heartburn medication from [facility]    [provider]  predniSONE (DELTASONE) 50 MG tablet Take 1 tablet (50 mg total) by mouth daily. 12/23/20   Henderly, Britni A, PA-C    Allergies    Naproxen, Sertraline, and Zoloft [sertraline hcl]  Review of Systems   Review of Systems  Constitutional:  Negative for appetite change and fatigue.  HENT:  Negative for congestion, ear discharge and sinus pressure.   Eyes:  Negative for discharge.  Respiratory:  Negative for cough.   Cardiovascular:  Negative for chest pain.  Gastrointestinal:  Negative for abdominal pain and diarrhea.  Genitourinary:  Negative for frequency and hematuria.  Musculoskeletal:  Positive for back pain.       Laceration left middle finger  Skin:  Negative for rash.  Neurological:  Negative for seizures and headaches.  Psychiatric/Behavioral:  Negative for hallucinations.    Physical Exam Updated Vital Signs BP 132/75   Pulse 73   Temp 98.3 F (36.8 C) (Oral)   Resp 19   SpO2 97%   Physical Exam Vitals and nursing note reviewed.  Constitutional:      Appearance: He is well-developed.  HENT:     Head: Normocephalic.     Nose: Nose normal.  Eyes:     General: No scleral icterus.    Conjunctiva/sclera: Conjunctivae normal.  Neck:     Thyroid: No thyromegaly.  Cardiovascular:     Rate and Rhythm: Normal rate and regular rhythm.     Heart sounds: No murmur heard.   No friction rub. No gallop.  Pulmonary:     Breath sounds: No stridor. No wheezing or rales.  Chest:     Chest wall: No tenderness.  Abdominal:     General: There is no distension.     Tenderness: There is no abdominal tenderness. There is no rebound.  Musculoskeletal:        General: Normal range of motion.     Cervical back: Neck supple.     Comments: Distal laceration left middle  finger nonsuturable.  Mild tenderness lumbar area and negative straight leg raise on the right  Lymphadenopathy:     Cervical: No cervical adenopathy.  Skin:    Findings: No erythema or rash.  Neurological:     Mental Status: He is alert and oriented to person, place, and time.     Motor: No abnormal muscle tone.     Coordination: Coordination normal.  Psychiatric:        Behavior: Behavior normal.    ED Results / Procedures / Treatments   Labs (all labs ordered are listed, but only abnormal results are displayed) Labs Reviewed  URINALYSIS, ROUTINE W REFLEX MICROSCOPIC - Abnormal; Notable for the following components:      Result Value   Glucose, UA 50 (*)    All other components within normal limits    EKG None  Radiology DG Lumbar Spine Complete  Result Date: 06/08/2021 CLINICAL DATA:  Constant low right back pain for several months. No known injury. EXAM: LUMBAR SPINE - COMPLETE 4+ VIEW COMPARISON:  Lumbar spine radiograph 12/23/2020 FINDINGS: There are 5 lumbar type vertebral bodies. Vertebral body height is maintained throughout the lumbar spine. There is no evidence of lumbar spine fracture. Alignment is normal. Multilevel mild-moderate degenerative disc disease, most pronounced at the L4-L5 and L5-S1 levels, unchanged compared to 12/23/2020. Mild calcific atherosclerosis of the distal abdominal aorta. IMPRESSION: No acute abnormality of the lumbar spine. Multilevel degenerative changes, most pronounced at the lower lumbar levels, stable compared to 12/23/2020. Aortic Atherosclerosis (ICD10-I70.0). Electronically Signed   By: Sherron Ales MD   On: 06/08/2021 09:54    Procedures Procedures   Medications Ordered in ED Medications - No data to display  ED Course  I have reviewed the triage vital signs and the nursing notes.  Pertinent labs & imaging results that were available during my care of the patient were reviewed by me and considered in my medical decision making  (see chart for details).    MDM Rules/Calculators/A&P                           Patient with a nonsuturable laceration.  He has had a tetanus shot recently.  He will clean the laceration twice a day with soap and water and follow-up as needed.  Also patient with lumbar strain he is placed on Ultram and will follow up with her PCP Final Clinical Impression(s) / ED Diagnoses Final diagnoses:  Acute right-sided low back pain with sciatica, sciatica laterality unspecified  Laceration of left middle finger without foreign body without damage to nail, initial  encounter    Rx / DC Orders ED Discharge Orders          Ordered    traMADol (ULTRAM) 50 MG tablet        06/08/21 1027             Bethann Berkshire, MD 06/08/21 1031

## 2021-06-11 ENCOUNTER — Emergency Department (HOSPITAL_COMMUNITY)
Admission: EM | Admit: 2021-06-11 | Discharge: 2021-06-11 | Disposition: A | Discharge status: Home / Self Care | Payer: Medicare Other | Attending: Emergency Medicine | Admitting: Emergency Medicine

## 2021-06-11 ENCOUNTER — Encounter (HOSPITAL_COMMUNITY): Payer: Self-pay | Admitting: Emergency Medicine

## 2021-06-11 DIAGNOSIS — E119 Type 2 diabetes mellitus without complications: Secondary | ICD-10-CM | POA: Diagnosis not present

## 2021-06-11 DIAGNOSIS — X58XXXD Exposure to other specified factors, subsequent encounter: Secondary | ICD-10-CM | POA: Insufficient documentation

## 2021-06-11 DIAGNOSIS — Z5189 Encounter for other specified aftercare: Secondary | ICD-10-CM | POA: Insufficient documentation

## 2021-06-11 DIAGNOSIS — I10 Essential (primary) hypertension: Secondary | ICD-10-CM | POA: Diagnosis not present

## 2021-06-11 DIAGNOSIS — F17210 Nicotine dependence, cigarettes, uncomplicated: Secondary | ICD-10-CM | POA: Insufficient documentation

## 2021-06-11 DIAGNOSIS — S61213D Laceration without foreign body of left middle finger without damage to nail, subsequent encounter: Secondary | ICD-10-CM | POA: Diagnosis not present

## 2021-06-11 NOTE — ED Triage Notes (Signed)
Pt back with c/o middle finger pain on his left hand , was seen for same on the 9th

## 2021-06-11 NOTE — ED Provider Notes (Signed)
[facility] EMERGENCY DEPARTMENT Provider Note   CSN: [id number] Arrival date & time: 06/11/21  2243     History No chief complaint on file.   Warren Gray is a 65 y.o. male.  Warren Gray is a 65 y.o. male with history of hypertension, hyperlipidemia and diabetes, presents to the ED for wound check.  Patient reports that he was seen at local ED on 8/9 initially for laceration, they did not place sutures.  He reports he just wanted to get it checked out today to make sure it was not looking infected.  He reports there is a small area of discoloration over the finger pad of the left middle finger.  He reports some mild soreness, no numbness or weakness and able to move his finger without difficulty.  No drainage or swelling.  The history is provided by the patient.      Past Medical History:  Diagnosis Date   Diabetes mellitus without complication (HCC)    High cholesterol    Hypertension     There are no problems to display for this patient.   History reviewed. No pertinent surgical history.     History reviewed. No pertinent family history.  Social History   Tobacco Use   Smoking status: Every Day    Packs/day: 1.00    Types: Cigarettes   Smokeless tobacco: Never  Vaping Use   Vaping Use: Never used  Substance Use Topics   Alcohol use: Yes    Comment: "Sometimes"   Drug use: No    Home Medications Prior to Admission medications   Medication Sig Start Date End Date Taking? Authorizing Provider  HYDROcodone-acetaminophen (NORCO/VICODIN) 5-325 MG tablet Take 1 tablet by mouth every 4 (four) hours as needed. 12/23/20   Henderly, Britni A, PA-C  lidocaine (LIDODERM) 5 % Place 1 patch onto the skin daily. Remove & Discard patch within 12 hours or as directed by MD 12/23/20   Henderly, Britni A, PA-C  orphenadrine (NORFLEX) 100 MG tablet Take 1 tablet (100 mg total) by mouth 2 (two) times daily. 12/20/20   Dione Booze, MD  OVER THE COUNTER MEDICATION  Take 1 tablet by mouth in the morning and at bedtime. Heartburn medication from [facility]    [provider]  predniSONE (DELTASONE) 50 MG tablet Take 1 tablet (50 mg total) by mouth daily. 12/23/20   Henderly, Britni A, PA-C  traMADol (ULTRAM) 50 MG tablet Requiring every 3 pounds as needed for pain not helped by Tylenol 06/08/21   Bethann Berkshire, MD    Allergies    Naproxen, Sertraline, and Zoloft [sertraline hcl]  Review of Systems   Review of Systems  Constitutional:  Negative for chills and fever.  Skin:  Positive for wound.  Neurological:  Negative for weakness and numbness.   Physical Exam Updated Vital Signs BP (!) 157/95 (BP Location: Right Arm)   Pulse 75   Temp 98.7 F (37.1 C) (Oral)   Resp 15   SpO2 98%   Physical Exam Vitals and nursing note reviewed.  Constitutional:      General: He is not in acute distress.    Appearance: Normal appearance. He is well-developed. He is not ill-appearing or diaphoretic.  HENT:     Head: Normocephalic and atraumatic.  Eyes:     General:        Right eye: No discharge.        Left eye: No discharge.  Pulmonary:     Effort: Pulmonary effort  is normal. No respiratory distress.  Musculoskeletal:     Comments: Well-healing wound to the digit pad of the left middle finger, there is a small amount of ecchymosis, no erythema, swelling, drainage or concern for infection.  Normal sensation, normal cap refill, full flexion and extension.  Neurological:     Mental Status: He is alert and oriented to person, place, and time.     Coordination: Coordination normal.  Psychiatric:        Mood and Affect: Mood normal.        Behavior: Behavior normal.      ED Results / Procedures / Treatments   Labs (all labs ordered are listed, but only abnormal results are displayed) Labs Reviewed - No data to display  EKG None  Radiology No results found.  Procedures Procedures   Medications Ordered in ED Medications - No data to  display  ED Course  I have reviewed the triage vital signs and the nursing notes.  Pertinent labs & imaging results that were available during my care of the patient were reviewed by me and considered in my medical decision making (see chart for details).    MDM Rules/Calculators/A&P                           Patient presents for evaluation of wound to the left middle finger, appears to be healing well, no signs of infection, neurovascularly intact.  Provided patient with reassurance and discussed appropriate return precautions.  He expresses understanding and agreement.  Discharged home in good condition.  Final Clinical Impression(s) / ED Diagnoses Final diagnoses:  None    Rx / DC Orders ED Discharge Orders     None        Legrand Rams 06/11/21 2306    Linwood Dibbles, MD 06/12/21 [phone]

## 2021-06-11 NOTE — Discharge Instructions (Addendum)
Wound looks like it is healing well, there is a small amount of discoloration but this looks like bruising and healing tissue, I do not see signs of infection today.  Continue to monitor for any worsening symptoms as wound is continuing to heal.

## 2021-06-16 ENCOUNTER — Encounter (HOSPITAL_COMMUNITY): Payer: Self-pay | Admitting: Emergency Medicine

## 2021-06-16 ENCOUNTER — Other Ambulatory Visit: Payer: Self-pay

## 2021-06-16 ENCOUNTER — Emergency Department (HOSPITAL_COMMUNITY)
Admission: EM | Admit: 2021-06-16 | Discharge: 2021-06-17 | Disposition: A | Discharge status: Home / Self Care | Payer: Medicare Other | Attending: Emergency Medicine | Admitting: Emergency Medicine

## 2021-06-16 DIAGNOSIS — E119 Type 2 diabetes mellitus without complications: Secondary | ICD-10-CM | POA: Insufficient documentation

## 2021-06-16 DIAGNOSIS — M79645 Pain in left finger(s): Secondary | ICD-10-CM | POA: Diagnosis present

## 2021-06-16 DIAGNOSIS — L03012 Cellulitis of left finger: Secondary | ICD-10-CM | POA: Diagnosis not present

## 2021-06-16 DIAGNOSIS — I10 Essential (primary) hypertension: Secondary | ICD-10-CM | POA: Diagnosis not present

## 2021-06-16 DIAGNOSIS — F17210 Nicotine dependence, cigarettes, uncomplicated: Secondary | ICD-10-CM | POA: Insufficient documentation

## 2021-06-16 MED ORDER — LIDOCAINE HCL 2 % IJ SOLN
INTRAMUSCULAR | Status: AC
  Filled 2021-06-16: qty 20

## 2021-06-16 MED ORDER — DOXYCYCLINE HYCLATE 100 MG PO TABS
100.0000 mg | ORAL_TABLET | Freq: Once | ORAL | Status: AC
  Administered 2021-06-17: 100 mg via ORAL
  Filled 2021-06-16: qty 1

## 2021-06-16 MED ORDER — DOXYCYCLINE HYCLATE 100 MG PO CAPS: 100.0000 mg | ORAL_CAPSULE | Freq: Two times a day (BID) | ORAL | 0 refills | Status: DC

## 2021-06-16 MED ORDER — LIDOCAINE HCL (PF) 1 % IJ SOLN
30.0000 mL | Freq: Once | INTRAMUSCULAR | Status: AC
  Administered 2021-06-17: 30 mL

## 2021-06-16 NOTE — ED Provider Notes (Signed)
Emergency Medicine Provider Triage Evaluation Note  Warren Gray , a 65 y.o. male  was evaluated in triage.  Pt complains of Left middle finger pain and swelling.  [facility]en at [facility] and told to soak the finger. Now painful, throbbing and worsening.   Review of Systems  Positive: Finger pain Negative: Fever  Physical Exam  BP 136/81 (BP Location: Left Arm)   Pulse 92   Temp 98.5 F (36.9 C) (Oral)   Resp 18   Ht 5\' 7"  (1.702 m)   Wt 72.6 kg   SpO2 96%   BMI 25.06 kg/m  Gen:   Awake, no distress   Resp:  Normal effort  MSK:   Moves extremities without difficulty  Other:  Large paronychia Left Middle finger  Medical Decision Making  Medically screening exam initiated at 10:14 PM.  Appropriate orders placed.  Elvis Laufer was informed that the remainder of the evaluation will be completed by another provider, this initial triage assessment does not replace that evaluation, and the importance of remaining in the ED until their evaluation is complete.  Paronychia- Lidocaine ordered.   Elmarie Shiley, PA-C 06/16/21 2345    06/18/21, DO 06/17/21 0007

## 2021-06-16 NOTE — ED Provider Notes (Signed)
[facility]EMERGENCY DEPT Provider Note   CSN: [id number] Arrival date & time: 06/16/21  2134     History Chief Complaint  Patient presents with   Hand Pain    Warren Gray is a 65 y.o. male.  Patient presents to the emergency department with a chief complaint of left middle finger pain.  He states that he noticed swelling to the tip of the finger around the nail earlier today.  He states that he clipped his nail too short.  He denies any fever.  Denies any treatment prior to arrival.  Denies any other associated symptoms.  The history is provided by the patient. No language interpreter was used.      Past Medical History:  Diagnosis Date   Diabetes mellitus without complication (HCC)    High cholesterol    Hypertension     There are no problems to display for this patient.   History reviewed. No pertinent surgical history.     No family history on file.  Social History   Tobacco Use   Smoking status: Every Day    Packs/day: 1.00    Types: Cigarettes   Smokeless tobacco: Never  Vaping Use   Vaping Use: Never used  Substance Use Topics   Alcohol use: Yes    Comment: "Sometimes"   Drug use: No    Home Medications Prior to Admission medications   Medication Sig Start Date End Date Taking? Authorizing Provider  HYDROcodone-acetaminophen (NORCO/VICODIN) 5-325 MG tablet Take 1 tablet by mouth every 4 (four) hours as needed. 12/23/20   Henderly, Britni A, PA-C  lidocaine (LIDODERM) 5 % Place 1 patch onto the skin daily. Remove & Discard patch within 12 hours or as directed by MD 12/23/20   Henderly, Britni A, PA-C  orphenadrine (NORFLEX) 100 MG tablet Take 1 tablet (100 mg total) by mouth 2 (two) times daily. 12/20/20   Dione Booze, MD  OVER THE COUNTER MEDICATION Take 1 tablet by mouth in the morning and at bedtime. Heartburn medication from [facility]    [provider]  predniSONE (DELTASONE) 50 MG tablet Take 1 tablet (50 mg total) by  mouth daily. 12/23/20   Henderly, Britni A, PA-C  traMADol (ULTRAM) 50 MG tablet Requiring every 3 pounds as needed for pain not helped by Tylenol 06/08/21   Bethann Berkshire, MD    Allergies    Naproxen, Sertraline, and Zoloft [sertraline hcl]  Review of Systems   Review of Systems  All other systems reviewed and are negative.  Physical Exam Updated Vital Signs BP 136/81 (BP Location: Left Arm)   Pulse 92   Temp 98.5 F (36.9 C) (Oral)   Resp 18   Ht 5\' 7"  (1.702 m)   Wt 72.6 kg   SpO2 96%   BMI 25.06 kg/m   Physical Exam Vitals and nursing note reviewed.  Constitutional:      General: He is not in acute distress.    Appearance: He is well-developed. He is not ill-appearing.  HENT:     Head: Normocephalic and atraumatic.  Eyes:     Conjunctiva/sclera: Conjunctivae normal.  Cardiovascular:     Rate and Rhythm: Normal rate.  Pulmonary:     Effort: Pulmonary effort is normal. No respiratory distress.  Abdominal:     General: There is no distension.  Musculoskeletal:     Cervical back: Neck supple.     Comments: Moves all extremities  Skin:    General: Skin is warm and dry.  Comments: Paronychia to left middle finger (radial aspect)  Neurological:     Mental Status: He is alert and oriented to person, place, and time.  Psychiatric:        Mood and Affect: Mood normal.        Behavior: Behavior normal.    ED Results / Procedures / Treatments   Labs (all labs ordered are listed, but only abnormal results are displayed) Labs Reviewed - No data to display  EKG None  Radiology No results found.  Procedures .Marland KitchenIncision and Drainage  Date/Time: 06/16/2021 10:58 PM Performed by: Roxy Horseman, PA-C Authorized by: Roxy Horseman, PA-C   Consent:    Consent obtained:  Verbal   Consent given by:  Patient   Risks discussed:  Bleeding, incomplete drainage, pain and damage to other organs   Alternatives discussed:  No treatment Universal protocol:     Procedure explained and questions answered to patient or proxy's satisfaction: yes     Relevant documents present and verified: yes     Test results available : yes     Imaging studies available: yes     Required blood products, implants, devices, and special equipment available: yes     Site/side marked: yes     Immediately prior to procedure, a time out was called: yes     Patient identity confirmed:  Verbally with patient Location:    Type:  Abscess Pre-procedure details:    Skin preparation:  Betadine Anesthesia:    Anesthesia method:  Nerve block   Block technique:  Digital block   Block injection procedure:  Anatomic landmarks identified, introduced needle, incremental injection, anatomic landmarks palpated and negative aspiration for blood   Block outcome:  Anesthesia achieved Procedure type:    Complexity:  Simple Procedure details:    Incision types:  Single straight   Incision depth:  Subcutaneous   Wound management:  Probed and deloculated, irrigated with saline and extensive cleaning   Drainage:  Purulent   Drainage amount:  Moderate Post-procedure details:    Procedure completion:  Tolerated well, no immediate complications   Medications Ordered in ED Medications  lidocaine (PF) (XYLOCAINE) 1 % injection 30 mL (has no administration in time range)  lidocaine (XYLOCAINE) 2 % (with pres) injection (has no administration in time range)    ED Course  I have reviewed the triage vital signs and the nursing notes.  Pertinent labs & imaging results that were available during my care of the patient were reviewed by me and considered in my medical decision making (see chart for details).    MDM Rules/Calculators/A&P                           Patient here with paronychia to the left middle finger.  We will anesthetized and drained.  Successful incision and drainage.  Return precautions discussed.  Patient understands agrees the plan.  He is stable and ready for  discharge. Final Clinical Impression(s) / ED Diagnoses Final diagnoses:  Paronychia of finger of left hand    Rx / DC Orders ED Discharge Orders          Ordered    doxycycline (VIBRAMYCIN) 100 MG capsule  2 times daily        06/16/21 2312             Roxy Horseman, PA-C 06/16/21 2313    Charlynne Pander, MD 06/17/21 [phone]

## 2021-06-16 NOTE — ED Triage Notes (Signed)
Patient arrives complaining of an infection on his finger and pain. Small paronychia noted.

## 2021-06-17 DIAGNOSIS — L03012 Cellulitis of left finger: Secondary | ICD-10-CM | POA: Diagnosis not present

## 2021-06-17 NOTE — ED Notes (Signed)
Pt discharged from this ED in stable condition at this time. All discharge instructions and follow up care reviewed with pt with no further questions at this time. Pt ambulatory with steady gait, clear speech.  

## 2021-06-19 ENCOUNTER — Emergency Department (HOSPITAL_COMMUNITY)
Admission: EM | Admit: 2021-06-19 | Discharge: 2021-06-20 | Disposition: A | Discharge status: Home / Self Care | Payer: Medicare Other | Attending: Emergency Medicine | Admitting: Emergency Medicine

## 2021-06-19 ENCOUNTER — Other Ambulatory Visit: Payer: Self-pay

## 2021-06-19 ENCOUNTER — Encounter (HOSPITAL_COMMUNITY): Payer: Self-pay | Admitting: *Deleted

## 2021-06-19 DIAGNOSIS — E119 Type 2 diabetes mellitus without complications: Secondary | ICD-10-CM | POA: Insufficient documentation

## 2021-06-19 DIAGNOSIS — L03012 Cellulitis of left finger: Secondary | ICD-10-CM | POA: Insufficient documentation

## 2021-06-19 DIAGNOSIS — I10 Essential (primary) hypertension: Secondary | ICD-10-CM | POA: Insufficient documentation

## 2021-06-19 DIAGNOSIS — F17210 Nicotine dependence, cigarettes, uncomplicated: Secondary | ICD-10-CM | POA: Insufficient documentation

## 2021-06-19 DIAGNOSIS — M79645 Pain in left finger(s): Secondary | ICD-10-CM | POA: Diagnosis present

## 2021-06-19 LAB — CBG MONITORING, ED: Glucose-Capillary: 149 mg/dL — ABNORMAL HIGH (ref 70–99)

## 2021-06-19 NOTE — ED Provider Notes (Signed)
Emergency Medicine Provider Triage Evaluation Note  Warren Gray , a 65 y.o. male  was evaluated in triage.  Pt complains of L middle finger pain. I&D last week, on doxycycline. Some improvement of pain, no discharge. Taking doxycycline. Also has midback that has been chronic  Review of Systems  Positive: Finger pain, back pain Negative: Fevers, saddles anesthesia, urinary retention, bilateral leg numbness   Physical Exam  There were no vitals taken for this visit. Gen:   Awake, no distress   Resp:  Normal effort  MSK:   Moves extremities without difficulty.  Other:  Swelling and fluid to left digit surrounding nail bed. Cap refill <2.   Medical Decision Making  Medically screening exam initiated at 11:13 PM.  Appropriate orders placed.  Kaylib Furness was informed that the remainder of the evaluation will be completed by another provider, this initial triage assessment does not replace that evaluation, and the importance of remaining in the ED until their evaluation is complete.     Theron Arista, PA-C 06/19/21 2315    Wynetta Fines, MD 06/20/21 [phone]

## 2021-06-19 NOTE — ED Triage Notes (Signed)
Pt c/o continued pain to finger that was tx last week.  Also c/o lower back pain.

## 2021-06-20 DIAGNOSIS — L03012 Cellulitis of left finger: Secondary | ICD-10-CM | POA: Diagnosis not present

## 2021-06-20 MED ORDER — LIDOCAINE HCL (PF) 1 % IJ SOLN
5.0000 mL | Freq: Once | INTRAMUSCULAR | Status: AC
  Administered 2021-06-20: 5 mL
  Filled 2021-06-20: qty 5

## 2021-06-20 NOTE — ED Notes (Signed)
Pt stated he was cutting cabbage 2 weeks ago and cut his finger. Pt was previously seen at [facility] where they drained the fluid filled blister on finger. He was given antibiotics which he has three more days left. Pt states once it was drained the next day it started filling back up with fluid.

## 2021-06-20 NOTE — ED Notes (Addendum)
Pt stated he has back pain. The doctor told him he has mild arthritis and possibly pulled a muscle. The pain starts in lower region that travels to center of back. Pt said he was prescribed Tramadol but is not getting any relief. Pt feels the worse when turning or coughing. Pt state back pain has been intermittent but it just seems to got worse over time within the last month.

## 2021-06-20 NOTE — ED Notes (Signed)
Notified the provider that l&d tray at the bedside.

## 2021-06-20 NOTE — ED Provider Notes (Signed)
[facility] EMERGENCY DEPARTMENT Provider Note   CSN: [id number] Arrival date & time: 06/19/21  2249     History Chief Complaint  Patient presents with   Finger Injury    Warren Gray is a 65 y.o. male.  Patient to ED for re-evaluation of finger tip infection, drained on 8/19. He states he did not start the antibiotic until 3 days ago and has been taking them as prescribed. He reports the pain and swelling was improved after the procedure but is worse now.  The history is provided by the patient. No language interpreter was used.      Past Medical History:  Diagnosis Date   Diabetes mellitus without complication (HCC)    High cholesterol    Hypertension     There are no problems to display for this patient.   History reviewed. No pertinent surgical history.     No family history on file.  Social History   Tobacco Use   Smoking status: Every Day    Packs/day: 1.00    Types: Cigarettes   Smokeless tobacco: Never  Vaping Use   Vaping Use: Never used  Substance Use Topics   Alcohol use: Yes    Comment: "Sometimes"   Drug use: No    Home Medications Prior to Admission medications   Medication Sig Start Date End Date Taking? Authorizing Provider  doxycycline (VIBRAMYCIN) 100 MG capsule Take 1 capsule (100 mg total) by mouth 2 (two) times daily. 06/16/21   Roxy Horseman, PA-C  HYDROcodone-acetaminophen (NORCO/VICODIN) 5-325 MG tablet Take 1 tablet by mouth every 4 (four) hours as needed. 12/23/20   Henderly, Britni A, PA-C  lidocaine (LIDODERM) 5 % Place 1 patch onto the skin daily. Remove & Discard patch within 12 hours or as directed by MD 12/23/20   Henderly, Britni A, PA-C  orphenadrine (NORFLEX) 100 MG tablet Take 1 tablet (100 mg total) by mouth 2 (two) times daily. 12/20/20   Dione Booze, MD  OVER THE COUNTER MEDICATION Take 1 tablet by mouth in the morning and at bedtime. Heartburn medication from [facility]    [provider]   predniSONE (DELTASONE) 50 MG tablet Take 1 tablet (50 mg total) by mouth daily. 12/23/20   Henderly, Britni A, PA-C  traMADol (ULTRAM) 50 MG tablet Requiring every 3 pounds as needed for pain not helped by Tylenol 06/08/21   Bethann Berkshire, MD    Allergies    Naproxen, Sertraline, and Zoloft [sertraline hcl]  Review of Systems   Review of Systems  Constitutional:  Negative for fever.  Musculoskeletal:        See HPI.  Skin:        See HPI.   Physical Exam Updated Vital Signs BP (!) 165/95 (BP Location: Right Arm)   Pulse 79   Temp 98.5 F (36.9 C) (Oral)   Resp 16   Ht 5\' 7"  (1.702 m)   Wt 72.6 kg   SpO2 97%   BMI 25.06 kg/m   Physical Exam Vitals and nursing note reviewed.  Constitutional:      Appearance: Normal appearance.  Musculoskeletal:     Comments: FROM all digits of left hand.  Skin:    Comments: Pus filled blister adjacent to lateral left 3rd fingernail c/w paronychia.   Neurological:     Mental Status: He is alert.    ED Results / Procedures / Treatments   Labs (all labs ordered are listed, but only abnormal results are displayed) Labs Reviewed  CBG MONITORING, ED - Abnormal; Notable for the following components:      Result Value   Glucose-Capillary 149 (*)    All other components within normal limits    EKG None  Radiology No results found.  Procedures .Marland KitchenIncision and Drainage  Date/Time: 06/20/2021 2:03 AM Performed by: Elpidio Anis, PA-C Authorized by: Elpidio Anis, PA-C   Consent:    Consent obtained:  Verbal   Consent given by:  Patient Universal protocol:    Procedure explained and questions answered to patient or proxy's satisfaction: yes     Immediately prior to procedure, a time out was called: yes     Patient identity confirmed:  Verbally with patient Location:    Type:  Fluid collection   Size:  2 cm   Location:  Upper extremity   Upper extremity location:  Finger   Finger location:  L long finger Pre-procedure  details:    Skin preparation:  Povidone-iodine Sedation:    Sedation type:  None Anesthesia:    Anesthesia method:  Nerve block   Block needle gauge:  25 G   Block anesthetic:  Lidocaine 1% w/o epi   Block injection procedure:  Introduced needle, incremental injection, anatomic landmarks identified, anatomic landmarks palpated and negative aspiration for blood   Block outcome:  Anesthesia achieved Procedure type:    Complexity:  Simple Procedure details:    Needle aspiration: no     Incision types:  Stab incision   Incision depth:  Dermal   Wound management:  Irrigated with saline   Drainage:  Bloody and purulent   Drainage amount:  Moderate   Wound treatment:  Wound left open   Packing materials:  None Post-procedure details:    Procedure completion:  Tolerated well, no immediate complications   Medications Ordered in ED Medications  lidocaine (PF) (XYLOCAINE) 1 % injection 5 mL (has no administration in time range)    ED Course  I have reviewed the triage vital signs and the nursing notes.  Pertinent labs & imaging results that were available during my care of the patient were reviewed by me and considered in my medical decision making (see chart for details).    MDM Rules/Calculators/A&P                           Patient to ED with recurrent paronychia requiring repeat I&D. Uncomplicated procedure as per above note.   Final Clinical Impression(s) / ED Diagnoses Final diagnoses:  None   Paronychia  Rx / DC Orders ED Discharge Orders     None        Elpidio Anis, PA-C 06/20/21 0205    Geoffery Lyons, MD 06/20/21 [phone]

## 2021-06-20 NOTE — Discharge Instructions (Addendum)
Continue the antibiotic until finished with entire prescription. Keep the finger clean, washing with soap and water twice daily.   Return to the ED with any new or concerning symptoms.

## 2021-06-21 ENCOUNTER — Encounter (HOSPITAL_COMMUNITY): Payer: Self-pay

## 2021-06-21 ENCOUNTER — Other Ambulatory Visit: Payer: Self-pay

## 2021-06-21 ENCOUNTER — Emergency Department (HOSPITAL_COMMUNITY)
Admission: EM | Admit: 2021-06-21 | Discharge: 2021-06-22 | Disposition: A | Discharge status: Home / Self Care | Payer: Medicare Other | Attending: Emergency Medicine | Admitting: Emergency Medicine

## 2021-06-21 DIAGNOSIS — E119 Type 2 diabetes mellitus without complications: Secondary | ICD-10-CM | POA: Insufficient documentation

## 2021-06-21 DIAGNOSIS — F17210 Nicotine dependence, cigarettes, uncomplicated: Secondary | ICD-10-CM | POA: Diagnosis not present

## 2021-06-21 DIAGNOSIS — I10 Essential (primary) hypertension: Secondary | ICD-10-CM | POA: Insufficient documentation

## 2021-06-21 DIAGNOSIS — G8929 Other chronic pain: Secondary | ICD-10-CM | POA: Insufficient documentation

## 2021-06-21 DIAGNOSIS — M546 Pain in thoracic spine: Secondary | ICD-10-CM | POA: Insufficient documentation

## 2021-06-21 DIAGNOSIS — M545 Low back pain, unspecified: Secondary | ICD-10-CM | POA: Diagnosis present

## 2021-06-21 NOTE — ED Provider Notes (Signed)
Emergency Medicine Provider Triage Evaluation Note  Warren Gray , a 65 y.o. male  was evaluated in triage.  Pt complains of back pain for the last three days. Has hx of chronic back pain. No fevers, no trauma to back. States that back pain moves around to different places in his back. No radiation. Also wants to be evaluated for paronychia of finger, had it drained recently is taking antibiotics for it. No longer having pain.   Review of Systems  Positive: Back pain  Negative: fever  Physical Exam  BP (!) 154/85 (BP Location: Left Arm)   Pulse 73   Temp 98.1 F (36.7 C) (Oral)   Resp 18   SpO2 (!) 87%  Gen:   Awake, no distress   Resp:  Normal effort  MSK:   Moves extremities without difficulty  Other:   Medical Decision Making  Medically screening exam initiated at 11:51 PM.  Appropriate orders placed.  Warren Gray was informed that the remainder of the evaluation will be completed by another provider, this initial triage assessment does not replace that evaluation, and the importance of remaining in the ED until their evaluation is complete.    Warren Gordon, PA-C 06/21/21 2356    Warren Kaplan, MD 06/22/21 [phone]

## 2021-06-21 NOTE — ED Notes (Signed)
Pt ambulatory in triage. 

## 2021-06-21 NOTE — ED Triage Notes (Signed)
Pt complains of back pain x 2 weeks. Pt wants a complete XRAY done of his back. Pt also reports a finger injury to his left middle finger from a week ago. Pt states that he has had to get it drained multiple times.

## 2021-06-22 ENCOUNTER — Emergency Department (HOSPITAL_COMMUNITY): Payer: Medicare Other

## 2021-06-22 DIAGNOSIS — M545 Low back pain, unspecified: Secondary | ICD-10-CM | POA: Diagnosis not present

## 2021-06-22 MED ORDER — METHOCARBAMOL 500 MG PO TABS: 500.0000 mg | ORAL_TABLET | Freq: Three times a day (TID) | ORAL | 0 refills | Status: DC | PRN

## 2021-06-22 MED ORDER — ACETAMINOPHEN 500 MG PO TABS
1000.0000 mg | ORAL_TABLET | Freq: Once | ORAL | Status: AC
  Administered 2021-06-22: 1000 mg via ORAL
  Filled 2021-06-22: qty 2

## 2021-06-22 MED ORDER — METHOCARBAMOL 500 MG PO TABS
1000.0000 mg | ORAL_TABLET | Freq: Once | ORAL | Status: AC
  Administered 2021-06-22: 1000 mg via ORAL
  Filled 2021-06-22: qty 2

## 2021-06-22 NOTE — Discharge Instructions (Addendum)
You were evaluated in the Emergency Department and after careful evaluation, we did not find any emergent condition requiring admission or further testing in the hospital.  Your exam/testing today was overall reassuring.  Please return to the Emergency Department if you experience any worsening of your condition.  Thank you for allowing us to be a part of your care.  

## 2021-06-22 NOTE — ED Provider Notes (Signed)
[facility]-EMERGENCY DEPT [facility] Emergency Department Provider Note MRN:  [id number]  Arrival date & time: 06/22/21     Chief Complaint   Back Pain and Finger Injury   History of Present Illness   Warren Gray is a 65 y.o. year-old male with a history of hypertension, diabetes, chronic back pain presenting to the ED with chief complaint of back pain.  Location: Thoracic and lumbar back pain, midline Duration: 3 months but worse over the past 2 days Onset: Gradual Timing: Constant pain Description: Dull ache Severity: Moderate to severe Exacerbating/Alleviating Factors: Worse with motion or palpation Associated Symptoms: None Pertinent Negatives: Denies numbness or weakness to the arms or legs, no bowel or bladder dysfunction, no fever, no trauma  Additional History: Also had a recent paronychia drainage, wants his finger looked at  Review of Systems  A complete 10 system review of systems was obtained and all systems are negative except as noted in the HPI and PMH.   Patient's Health History    Past Medical History:  Diagnosis Date   Diabetes mellitus without complication (HCC)    High cholesterol    Hypertension     History reviewed. No pertinent surgical history.  History reviewed. No pertinent family history.  Social History   Socioeconomic History   Marital status: Single    Spouse name: Not on file   Number of children: Not on file   Years of education: Not on file   Highest education level: Not on file  Occupational History   Not on file  Tobacco Use   Smoking status: Every Day    Packs/day: 1.00    Types: Cigarettes   Smokeless tobacco: Never  Vaping Use   Vaping Use: Never used  Substance and Sexual Activity   Alcohol use: Yes    Comment: "Sometimes"   Drug use: No   Sexual activity: Not on file  Other Topics Concern   Not on file  Social History Narrative   Not on file   Social Determinants of Health   Financial Resource Strain: Not on  file  Food Insecurity: Not on file  Transportation Needs: Not on file  Physical Activity: Not on file  Stress: Not on file  Social Connections: Not on file  Intimate Partner Violence: Not on file     Physical Exam   Vitals:   06/22/21 0300 06/22/21 0330  BP: (!) 182/97 140/86  Pulse: 77 72  Resp: 20 20  Temp:    SpO2: 96% 98%    CONSTITUTIONAL: Chronically ill-appearing, NAD NEURO:  Alert and oriented x 3, no focal deficits EYES:  eyes equal and reactive ENT/NECK:  no LAD, no JVD CARDIO: Regular rate, well-perfused, normal S1 and S2 PULM:  CTAB no wheezing or rhonchi GI/GU:  normal bowel sounds, non-distended, non-tender MSK/SPINE:  No gross deformities, no edema SKIN:  no rash, atraumatic PSYCH:  Appropriate speech and behavior  *Additional and/or pertinent findings included in MDM below  Diagnostic and Interventional Summary    EKG Interpretation  Date/Time:    Ventricular Rate:    PR Interval:    QRS Duration:   QT Interval:    QTC Calculation:   R Axis:     Text Interpretation:         Labs Reviewed - No data to display  DG Thoracic Spine 2 View  Final Result    DG Lumbar Spine Complete  Final Result      Medications  methocarbamol (ROBAXIN) tablet 1,000 mg (  1,000 mg Oral Given 06/22/21 0413)  acetaminophen (TYLENOL) tablet 1,000 mg (1,000 mg Oral Given 06/22/21 1914)     Procedures  /  Critical Care Procedures  ED Course and Medical Decision Making  I have reviewed the triage vital signs, the nursing notes, and pertinent available records from the EMR.  Listed above are laboratory and imaging tests that I personally ordered, reviewed, and interpreted and then considered in my medical decision making (see below for details).  Acute on chronic back pain, no red flag symptoms to suggest myelopathy, no fever, no abdominal pain or tenderness.  Reassuring neurological exam.  Patient requesting x-rays, not unreasonable given age and duration of pain,  will help exclude or evaluate for neoplasm such as metastatic prostate cancer.  Otherwise anticipating discharge.  Finger with recent paronychia drainage is well-healing, provided reassurance.     X-rays normal, appropriate for discharge.  Elmer Sow. Pilar Plate, MD [facility] Emergency Medicine [facility] [email or web address]  Final Clinical Impressions(s) / ED Diagnoses     ICD-10-CM   1. Chronic midline low back pain without sciatica  M54.50    G89.29       ED Discharge Orders          Ordered    methocarbamol (ROBAXIN) 500 MG tablet  Every 8 hours PRN        06/22/21 0448             Discharge Instructions Discussed with and Provided to Patient:     Discharge Instructions      You were evaluated in the Emergency Department and after careful evaluation, we did not find any emergent condition requiring admission or further testing in the hospital.  Your exam/testing today was overall reassuring.  Please return to the Emergency Department if you experience any worsening of your condition.  Thank you for allowing [place] to be a part of your care.         Sabas Sous, MD 06/22/21 [phone]

## 2021-07-04 ENCOUNTER — Emergency Department (HOSPITAL_COMMUNITY)
Admission: EM | Admit: 2021-07-04 | Discharge: 2021-07-05 | Disposition: A | Discharge status: Home / Self Care | Payer: Medicare Other | Attending: Emergency Medicine | Admitting: Emergency Medicine

## 2021-07-04 ENCOUNTER — Encounter (HOSPITAL_COMMUNITY): Payer: Self-pay

## 2021-07-04 ENCOUNTER — Other Ambulatory Visit: Payer: Self-pay

## 2021-07-04 ENCOUNTER — Emergency Department (HOSPITAL_COMMUNITY): Payer: Medicare Other

## 2021-07-04 DIAGNOSIS — S0001XA Abrasion of scalp, initial encounter: Secondary | ICD-10-CM | POA: Diagnosis not present

## 2021-07-04 DIAGNOSIS — E119 Type 2 diabetes mellitus without complications: Secondary | ICD-10-CM | POA: Insufficient documentation

## 2021-07-04 DIAGNOSIS — F17210 Nicotine dependence, cigarettes, uncomplicated: Secondary | ICD-10-CM | POA: Insufficient documentation

## 2021-07-04 DIAGNOSIS — T07XXXA Unspecified multiple injuries, initial encounter: Secondary | ICD-10-CM

## 2021-07-04 DIAGNOSIS — I10 Essential (primary) hypertension: Secondary | ICD-10-CM | POA: Insufficient documentation

## 2021-07-04 DIAGNOSIS — S0990XA Unspecified injury of head, initial encounter: Secondary | ICD-10-CM | POA: Diagnosis present

## 2021-07-04 DIAGNOSIS — S40811A Abrasion of right upper arm, initial encounter: Secondary | ICD-10-CM | POA: Diagnosis not present

## 2021-07-04 DIAGNOSIS — S0003XA Contusion of scalp, initial encounter: Secondary | ICD-10-CM
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
## 2021-07-20 ENCOUNTER — Emergency Department (HOSPITAL_COMMUNITY)
Admission: EM | Admit: 2021-07-20 | Discharge: 2021-07-21 | Disposition: A | Discharge status: Home / Self Care | Payer: Medicare Other | Source: Home / Self Care | Attending: Emergency Medicine | Admitting: Emergency Medicine

## 2021-07-20 ENCOUNTER — Emergency Department (HOSPITAL_COMMUNITY)
Admission: EM | Admit: 2021-07-20 | Discharge: 2021-07-20 | Disposition: A | Discharge status: Home / Self Care | Payer: Medicare Other | Attending: Emergency Medicine | Admitting: Emergency Medicine

## 2021-07-20 ENCOUNTER — Encounter (HOSPITAL_COMMUNITY): Payer: Self-pay | Admitting: *Deleted

## 2021-07-20 ENCOUNTER — Other Ambulatory Visit: Payer: Self-pay

## 2021-07-20 DIAGNOSIS — Z5321 Procedure and treatment not carried out due to patient leaving prior to being seen by health care provider: Secondary | ICD-10-CM | POA: Insufficient documentation

## 2021-07-20 DIAGNOSIS — R109 Unspecified abdominal pain: Secondary | ICD-10-CM | POA: Insufficient documentation

## 2021-07-20 DIAGNOSIS — R198 Other specified symptoms and signs involving the digestive system and abdomen: Secondary | ICD-10-CM | POA: Diagnosis present

## 2021-07-20 DIAGNOSIS — R197 Diarrhea, unspecified: Secondary | ICD-10-CM | POA: Diagnosis not present

## 2021-07-20 DIAGNOSIS — R3915 Urgency of urination: Secondary | ICD-10-CM | POA: Diagnosis not present

## 2021-07-20 NOTE — ED Triage Notes (Signed)
Pt says he has bowel urgency and sometimes incontinence. Triggered by anytime he drinks something. He denies abdominal pain. Increased gas.

## 2021-07-20 NOTE — ED Notes (Signed)
Patient told this tech he has to leave to go to work, patient stated he knew he needed to be seen by a doctor and would return to the emergency room after his shift. Patient was encouraged to stay by staff but patient left the emergency room.

## 2021-07-20 NOTE — ED Notes (Signed)
Pt refused his labs during triage

## 2021-07-20 NOTE — ED Provider Notes (Signed)
Emergency Medicine Provider Triage Evaluation Note  Warren Gray , a 65 y.o. male  was evaluated in triage.  Pt complains of tenesmus.  The patient reports that he has been having increased urgency to have a bowel movement for the last month.  He reports intermittent abdominal cramping.  He has had loose stools, but they have not been liquid.  He states that he can have a bowel movement when he leaves the house, but soon after leaving the house he can have the urgency to have about another bowel movement and sometimes he will have to stop and have a bowel movement behind a tree or behind a dumpster.  He states that sometimes the intensity of the urgency will not allow him to hold his stool, but he denies urinary or fecal incontinence, saddle paresthesias, fever, chills, nausea, vomiting, weight loss, dysuria, hematuria.  Review of Systems  Positive: Tenesmus, abdominal cramping Negative: Saddle paresthesias, urinary fecal incontinence, fever, chills, nausea, vomiting, weight loss, dysuria, hematuria, penile or testicular pain or swelling  Physical Exam  BP 118/67 (BP Location: Left Arm)   Pulse 74   Temp 98.1 F (36.7 C) (Oral)   Resp 15   SpO2 97%  Gen:   Awake, no distress   Resp:  Normal effort  MSK:   Moves extremities without difficulty  Other:  Abdomen is protuberant, but soft and nondistended.  No tenderness to palpation.  Medical Decision Making  Medically screening exam initiated at 1:27 AM.  Appropriate orders placed.  Warren Gray was informed that the remainder of the evaluation will be completed by another provider, this initial triage assessment does not replace that evaluation, and the importance of remaining in the ED until their evaluation is complete.  Labs have been ordered in the emergency department.  He will require further work-up and evaluation.   Frederik Pear A, PA-C 07/20/21 0130    Glynn Octave, MD 07/20/21 [phone]

## 2021-07-21 ENCOUNTER — Other Ambulatory Visit: Payer: Self-pay

## 2021-07-21 ENCOUNTER — Encounter (HOSPITAL_COMMUNITY): Payer: Self-pay | Admitting: *Deleted

## 2021-07-21 DIAGNOSIS — R198 Other specified symptoms and signs involving the digestive system and abdomen: Secondary | ICD-10-CM | POA: Diagnosis not present

## 2021-07-21 LAB — CBC WITH DIFFERENTIAL/PLATELET
Abs Immature Granulocytes: 0.02 10*3/uL (ref 0.00–0.07)
Basophils Absolute: 0.1 10*3/uL (ref 0.0–0.1)
Basophils Relative: 1 %
Eosinophils Absolute: 0.2 10*3/uL (ref 0.0–0.5)
Eosinophils Relative: 3 %
HCT: 42.1 % (ref 39.0–52.0)
Hemoglobin: 14.2 g/dL (ref 13.0–17.0)
Immature Granulocytes: 0 %
Lymphocytes Relative: 37 %
Lymphs Abs: 3 10*3/uL (ref 0.7–4.0)
MCH: 33 pg (ref 26.0–34.0)
MCHC: 33.7 g/dL (ref 30.0–36.0)
MCV: 97.9 fL (ref 80.0–100.0)
Monocytes Absolute: 0.5 10*3/uL (ref 0.1–1.0)
Monocytes Relative: 6 %
Neutro Abs: 4.4 10*3/uL (ref 1.7–7.7)
Neutrophils Relative %: 53 %
Platelets: 189 10*3/uL (ref 150–400)
RBC: 4.3 MIL/uL (ref 4.22–5.81)
RDW: 12.5 % (ref 11.5–15.5)
WBC: 8.2 10*3/uL (ref 4.0–10.5)
nRBC: 0 % (ref 0.0–0.2)

## 2021-07-21 LAB — COMPREHENSIVE METABOLIC PANEL
ALT: 32 U/L (ref 0–44)
AST: 25 U/L (ref 15–41)
Albumin: 3.7 g/dL (ref 3.5–5.0)
Alkaline Phosphatase: 74 U/L (ref 38–126)
Anion gap: 10 (ref 5–15)
BUN: 14 mg/dL (ref 8–23)
CO2: 23 mmol/L (ref 22–32)
Calcium: 9.3 mg/dL (ref 8.9–10.3)
Chloride: 103 mmol/L (ref 98–111)
Creatinine, Ser: 1.02 mg/dL (ref 0.61–1.24)
GFR, Estimated: >60 mL/min (ref >60)
Glucose, Bld: 180 mg/dL — ABNORMAL HIGH (ref 70–99)
Potassium: 3.3 mmol/L — ABNORMAL LOW (ref 3.5–5.1)
Sodium: 136 mmol/L (ref 135–145)
Total Bilirubin: 0.8 mg/dL (ref 0.3–1.2)
Total Protein: 6.6 g/dL (ref 6.5–8.1)

## 2021-07-21 LAB — LIPASE, BLOOD: Lipase: 59 U/L — ABNORMAL HIGH (ref 11–51)

## 2021-07-21 NOTE — ED Provider Notes (Signed)
Emergency Medicine Provider Triage Evaluation Note  Jovannie Ulibarri , a 65 y.o. male  was evaluated in triage.  Pt complains of constant, moderate, non-radiating abdominal pain.  States it has been going on for a while.  States he has to be at work in the morning.  Review of Systems  Positive: Abdominal pain Negative: Fever, chills  Physical Exam  BP 104/65 (BP Location: Left Arm)   Pulse 73   Temp 98 F (36.7 C)   Resp 16   SpO2 98%  Gen:   Awake, no distress   Resp:  Normal effort  MSK:   Moves extremities without difficulty  Other:    Medical Decision Making  Medically screening exam initiated at 12:26 AM.  Appropriate orders placed.  Rhea Thrun was informed that the remainder of the evaluation will be completed by another provider, this initial triage assessment does not replace that evaluation, and the importance of remaining in the ED until their evaluation is complete.  Abdominal pain   Roxy Horseman, PA-C 07/21/21 2876    Gilda Crease, MD 07/22/21 [phone]

## 2021-07-21 NOTE — ED Triage Notes (Signed)
PT has irregular bowel movements.

## 2021-07-24 DIAGNOSIS — E119 Type 2 diabetes mellitus without complications: Secondary | ICD-10-CM | POA: Diagnosis not present

## 2021-07-24 DIAGNOSIS — R197 Diarrhea, unspecified: Secondary | ICD-10-CM | POA: Insufficient documentation

## 2021-07-24 DIAGNOSIS — F17210 Nicotine dependence, cigarettes, uncomplicated: Secondary | ICD-10-CM | POA: Insufficient documentation

## 2021-07-24 DIAGNOSIS — I10 Essential (primary) hypertension: Secondary | ICD-10-CM | POA: Diagnosis not present

## 2021-07-25 ENCOUNTER — Emergency Department (HOSPITAL_COMMUNITY)
Admission: EM | Admit: 2021-07-25 | Discharge: 2021-07-25 | Disposition: A | Discharge status: Home / Self Care | Payer: Medicare Other | Attending: Student | Admitting: Student

## 2021-07-25 ENCOUNTER — Other Ambulatory Visit: Payer: Self-pay

## 2021-07-25 ENCOUNTER — Encounter (HOSPITAL_COMMUNITY): Payer: Self-pay | Admitting: Student

## 2021-07-25 DIAGNOSIS — R197 Diarrhea, unspecified: Secondary | ICD-10-CM

## 2021-07-25 LAB — CBC WITH DIFFERENTIAL/PLATELET
Abs Immature Granulocytes: 0.02 10*3/uL (ref 0.00–0.07)
Basophils Absolute: 0.1 10*3/uL (ref 0.0–0.1)
Basophils Relative: 1 %
Eosinophils Absolute: 0.2 10*3/uL (ref 0.0–0.5)
Eosinophils Relative: 3 %
HCT: 43.9 % (ref 39.0–52.0)
Hemoglobin: 14.3 g/dL (ref 13.0–17.0)
Immature Granulocytes: 0 %
Lymphocytes Relative: 41 %
Lymphs Abs: 3.1 10*3/uL (ref 0.7–4.0)
MCH: 32.8 pg (ref 26.0–34.0)
MCHC: 32.6 g/dL (ref 30.0–36.0)
MCV: 100.7 fL — ABNORMAL HIGH (ref 80.0–100.0)
Monocytes Absolute: 0.5 10*3/uL (ref 0.1–1.0)
Monocytes Relative: 6 %
Neutro Abs: 3.5 10*3/uL (ref 1.7–7.7)
Neutrophils Relative %: 49 %
Platelets: 184 10*3/uL (ref 150–400)
RBC: 4.36 MIL/uL (ref 4.22–5.81)
RDW: 12.6 % (ref 11.5–15.5)
WBC: 7.4 10*3/uL (ref 4.0–10.5)
nRBC: 0 % (ref 0.0–0.2)

## 2021-07-25 LAB — COMPREHENSIVE METABOLIC PANEL
ALT: 28 U/L (ref 0–44)
AST: 25 U/L (ref 15–41)
Albumin: 3.8 g/dL (ref 3.5–5.0)
Alkaline Phosphatase: 69 U/L (ref 38–126)
Anion gap: 12 (ref 5–15)
BUN: 13 mg/dL (ref 8–23)
CO2: 19 mmol/L — ABNORMAL LOW (ref 22–32)
Calcium: 9.2 mg/dL (ref 8.9–10.3)
Chloride: 107 mmol/L (ref 98–111)
Creatinine, Ser: 0.89 mg/dL (ref 0.61–1.24)
GFR, Estimated: >60 mL/min (ref >60)
Glucose, Bld: 107 mg/dL — ABNORMAL HIGH (ref 70–99)
Potassium: 3.5 mmol/L (ref 3.5–5.1)
Sodium: 138 mmol/L (ref 135–145)
Total Bilirubin: 0.5 mg/dL (ref 0.3–1.2)
Total Protein: 6.8 g/dL (ref 6.5–8.1)

## 2021-07-25 MED ORDER — DICYCLOMINE HCL 20 MG PO TABS: 20.0000 mg | ORAL_TABLET | Freq: Two times a day (BID) | ORAL | 0 refills | Status: DC

## 2021-07-25 NOTE — ED Triage Notes (Signed)
The pt reports that  he has had diarrhea for 1-2 years  the pt is homeless and frrequents the [employer]

## 2021-07-25 NOTE — ED Notes (Signed)
Patient discharge instructions reviewed with the patient. The patient verbalized understanding of instructions. Patient discharged. 

## 2021-07-25 NOTE — ED Provider Notes (Signed)
Emergency Medicine Provider Triage Evaluation Note  Warren Gray , a 65 y.o. male  was evaluated in triage.  Pt complains of diarrhea for the past several days, multiple episodes per day, no alleviating/aggravating factors. No recent foreign travel or abx.   Review of Systems  Positive: diarrhea Negative: Abdominal pain, fever, melena, vomiting.   Physical Exam  BP 113/66   Pulse 71   Temp (!) 97.5 F (36.4 C) (Oral)   Resp 16   SpO2 95%  Gen:   Awake, no distress   Resp:  Normal effort  MSK:   Moves extremities without difficulty  Other:  No peritoneal signs on abdominal exam   Medical Decision Making  Medically screening exam initiated at 3:07 AM.  Appropriate orders placed.  [name] was informed that the remainder of the evaluation will be completed by another provider, this initial triage assessment does not replace that evaluation, and the importance of remaining in the ED until their evaluation is complete.  Diarrhea.    Cherly Anderson, PA-C 07/25/21 0310    Sabas Sous, MD 07/25/21 [phone]

## 2021-07-25 NOTE — ED Provider Notes (Signed)
[facility] EMERGENCY DEPARTMENT Provider Note   CSN: [id number] Arrival date & time: 07/24/21  2305     History Chief Complaint  Patient presents with   Diarrhea    Warren Gray is a 65 y.o. male.  The history is provided by the patient and medical records. No language interpreter was used.  Diarrhea  65 year old male seen in history of diabetes, hypertension, hypercholesterolemia, homelessness, who presents with complaints of having  diarrhea.  Patient states that he truly not having diarrhea which is frequent loose stools usually brought on by consuming carbohydrates, coffee, or different types of sodas.  This is ongoing for at least 3 years.  No associated nausea or vomiting.  He denies any appetite changes.  He had a colonoscopy less than 10 years ago and report it was normal.  Has history of heartburn but he does take Prilosec which helps.  No fever no chest pain or shortness of breath.  No report of any blood in his stool and denies any recent antibiotic use.     Past Medical History:  Diagnosis Date   Diabetes mellitus without complication (HCC)    High cholesterol    Hypertension     There are no problems to display for this patient.   History reviewed. No pertinent surgical history.     History reviewed. No pertinent family history.  Social History   Tobacco Use   Smoking status: Every Day    Packs/day: 1.00    Types: Cigarettes   Smokeless tobacco: Never  Vaping Use   Vaping Use: Never used  Substance Use Topics   Alcohol use: Yes    Comment: "Sometimes"   Drug use: No    Home Medications Prior to Admission medications   Medication Sig Start Date End Date Taking? Authorizing Provider  doxycycline (VIBRAMYCIN) 100 MG capsule Take 1 capsule (100 mg total) by mouth 2 (two) times daily. 06/16/21   Roxy Horseman, PA-C  methocarbamol (ROBAXIN) 500 MG tablet Take 1 tablet (500 mg total) by mouth every 8 (eight) hours as needed for  muscle spasms. 06/22/21   Sabas Sous, MD  omeprazole (PRILOSEC) 20 MG capsule Take 20 mg by mouth daily.    [provider]    Allergies    Naproxen, Sertraline, and Zoloft [sertraline hcl]  Review of Systems   Review of Systems  Gastrointestinal:  Positive for diarrhea.  All other systems reviewed and are negative.  Physical Exam Updated Vital Signs BP (!) 151/92   Pulse 77   Temp (!) 97.5 F (36.4 C) (Oral)   Resp 16   Ht 5\' 7"  (1.702 m)   Wt 72.6 kg   SpO2 96%   BMI 25.07 kg/m   Physical Exam Vitals and nursing note reviewed.  Constitutional:      General: He is not in acute distress.    Appearance: He is well-developed.  HENT:     Head: Atraumatic.  Eyes:     Conjunctiva/sclera: Conjunctivae normal.  Cardiovascular:     Rate and Rhythm: Normal rate and regular rhythm.     Pulses: Normal pulses.     Heart sounds: Normal heart sounds.  Pulmonary:     Breath sounds: Normal breath sounds.  Abdominal:     Palpations: Abdomen is soft.     Tenderness: There is no abdominal tenderness.  Musculoskeletal:     Cervical back: Neck supple.  Skin:    Findings: No rash.  Neurological:  Mental Status: He is alert.    ED Results / Procedures / Treatments   Labs (all labs ordered are listed, but only abnormal results are displayed) Labs Reviewed  COMPREHENSIVE METABOLIC PANEL - Abnormal; Notable for the following components:      Result Value   CO2 19 (*)    Glucose, Bld 107 (*)    All other components within normal limits  CBC WITH DIFFERENTIAL/PLATELET - Abnormal; Notable for the following components:   MCV 100.7 (*)    All other components within normal limits    EKG None  Radiology No results found.  Procedures Procedures   Medications Ordered in ED Medications - No data to display  ED Course  I have reviewed the triage vital signs and the nursing notes.  Pertinent labs & imaging results that were available during my care of the  patient were reviewed by me and considered in my medical decision making (see chart for details).    MDM Rules/Calculators/A&P                           BP (!) 151/92   Pulse 77   Temp (!) 97.5 F (36.4 C) (Oral)   Resp 16   Ht 5\' 7"  (1.702 m)   Wt 72.6 kg   SpO2 96%   BMI 25.07 kg/m   Final Clinical Impression(s) / ED Diagnoses Final diagnoses:  Diarrhea, unspecified type    Rx / DC Orders ED Discharge Orders          Ordered    dicyclomine (BENTYL) 20 MG tablet  2 times daily        07/25/21 0804           Patient report having frequent loose stools and signs of bloatedness after consuming certain types of carbohydrates, coffee, and sodas.  Denies any active abdominal pain or fever.  No infectious symptoms.  Symptoms that he described has been ongoing for several years.  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  No red flags.  Benign abdominal exam.  Encourage patient to follow-up with PCP for further management, will prescribe Bentyl for for comfort.   07/27/21, PA-C 07/25/21 07/27/21    [id number], MD 07/25/21 [phone]

## 2021-07-25 NOTE — Discharge Instructions (Addendum)
Please follow-up with your primary care provider for further managements of your recurrent abdominal discomfort.  You may take dicyclomine as prescribed to help with bloatedness.

## 2021-08-09 DIAGNOSIS — Z5321 Procedure and treatment not carried out due to patient leaving prior to being seen by health care provider: Secondary | ICD-10-CM | POA: Diagnosis not present

## 2021-08-09 DIAGNOSIS — M79604 Pain in right leg: Secondary | ICD-10-CM | POA: Diagnosis not present

## 2021-08-09 DIAGNOSIS — M25561 Pain in right knee: Secondary | ICD-10-CM | POA: Insufficient documentation

## 2021-08-09 DIAGNOSIS — M25551 Pain in right hip: Secondary | ICD-10-CM | POA: Diagnosis not present

## 2021-08-09 DIAGNOSIS — R209 Unspecified disturbances of skin sensation: Secondary | ICD-10-CM | POA: Diagnosis not present

## 2021-08-10 ENCOUNTER — Emergency Department (HOSPITAL_COMMUNITY): Payer: Medicare Other

## 2021-08-10 ENCOUNTER — Emergency Department (HOSPITAL_COMMUNITY)
Admission: EM | Admit: 2021-08-10 | Discharge: 2021-08-10 | Disposition: A | Discharge status: Home / Self Care | Payer: Medicare Other | Attending: Emergency Medicine | Admitting: Emergency Medicine

## 2021-08-10 ENCOUNTER — Encounter (HOSPITAL_COMMUNITY): Payer: Self-pay | Admitting: Emergency Medicine

## 2021-08-10 ENCOUNTER — Other Ambulatory Visit: Payer: Self-pay

## 2021-08-10 DIAGNOSIS — M25551 Pain in right hip: Secondary | ICD-10-CM | POA: Diagnosis not present

## 2021-08-10 MED ORDER — IBUPROFEN 200 MG PO TABS
600.0000 mg | ORAL_TABLET | Freq: Once | ORAL | Status: AC
  Administered 2021-08-10: 600 mg via ORAL
  Filled 2021-08-10: qty 3

## 2021-08-10 NOTE — ED Triage Notes (Signed)
Patient arrives complaining of right hip/leg pain. Patient states that he was at work when it suddenly began to hurt. Patient states pain radiates through his right buttock and into his leg towards his knee. Patient states that he does have some thigh numbness. Patient is ambulatory with a steady gait.

## 2021-08-10 NOTE — ED Provider Notes (Signed)
MSE was initiated and I personally evaluated the patient and placed orders (if any) at  1:19 AM on August 10, 2021.  To ED with right hip pain that started today without injury. Denies previous symptoms. Has not taken anything for pain.   Today's Vitals   08/10/21 0103  BP: (!) 144/91  Pulse: 70  Resp: 16  Temp: (!) 97.5 F (36.4 C)  SpO2: 98%   There is no height or weight on file to calculate BMI.  Right hip nontender. Distal pulses present.     The patient appears stable so that the remainder of the MSE may be completed by another provider.   Elpidio Anis, PA-C 08/10/21 0120    Tilden Fossa, MD 08/10/21 [phone]

## 2021-08-10 NOTE — ED Notes (Signed)
Patient ambulatory to XR

## 2021-09-08 ENCOUNTER — Other Ambulatory Visit: Payer: Self-pay | Admitting: Family Medicine

## 2021-09-08 DIAGNOSIS — F17219 Nicotine dependence, cigarettes, with unspecified nicotine-induced disorders: Secondary | ICD-10-CM

## 2021-09-09 ENCOUNTER — Other Ambulatory Visit: Payer: Self-pay | Admitting: Family Medicine

## 2021-09-09 DIAGNOSIS — F17210 Nicotine dependence, cigarettes, uncomplicated: Secondary | ICD-10-CM

## 2021-10-07 ENCOUNTER — Ambulatory Visit: Payer: Medicare Other

## 2021-10-13 ENCOUNTER — Emergency Department (HOSPITAL_COMMUNITY): Payer: Medicare Other

## 2021-10-13 ENCOUNTER — Encounter (HOSPITAL_COMMUNITY): Payer: Self-pay

## 2021-10-13 ENCOUNTER — Emergency Department (HOSPITAL_COMMUNITY)
Admission: EM | Admit: 2021-10-13 | Discharge: 2021-10-13 | Disposition: A | Discharge status: Home / Self Care | Payer: Medicare Other | Attending: Emergency Medicine | Admitting: Emergency Medicine

## 2021-10-13 DIAGNOSIS — R059 Cough, unspecified: Secondary | ICD-10-CM | POA: Diagnosis present

## 2021-10-13 DIAGNOSIS — I10 Essential (primary) hypertension: Secondary | ICD-10-CM | POA: Insufficient documentation

## 2021-10-13 DIAGNOSIS — F17210 Nicotine dependence, cigarettes, uncomplicated: Secondary | ICD-10-CM | POA: Diagnosis not present

## 2021-10-13 DIAGNOSIS — E119 Type 2 diabetes mellitus without complications: Secondary | ICD-10-CM | POA: Diagnosis not present

## 2021-10-13 DIAGNOSIS — Z20822 Contact with and (suspected) exposure to covid-19: Secondary | ICD-10-CM | POA: Insufficient documentation

## 2021-10-13 DIAGNOSIS — J181 Lobar pneumonia, unspecified organism: Secondary | ICD-10-CM | POA: Insufficient documentation

## 2021-10-13 DIAGNOSIS — J189 Pneumonia, unspecified organism: Secondary | ICD-10-CM

## 2021-10-13 LAB — CBC WITH DIFFERENTIAL/PLATELET
Abs Immature Granulocytes: 0.12 10*3/uL — ABNORMAL HIGH (ref 0.00–0.07)
Basophils Absolute: 0.1 10*3/uL (ref 0.0–0.1)
Basophils Relative: 0 %
Eosinophils Absolute: 0 10*3/uL (ref 0.0–0.5)
Eosinophils Relative: 0 %
HCT: 41.6 % (ref 39.0–52.0)
Hemoglobin: 13.8 g/dL (ref 13.0–17.0)
Immature Granulocytes: 1 %
Lymphocytes Relative: 7 %
Lymphs Abs: 1.2 10*3/uL (ref 0.7–4.0)
MCH: 32.7 pg (ref 26.0–34.0)
MCHC: 33.2 g/dL (ref 30.0–36.0)
MCV: 98.6 fL (ref 80.0–100.0)
Monocytes Absolute: 1 10*3/uL (ref 0.1–1.0)
Monocytes Relative: 6 %
Neutro Abs: 15.4 10*3/uL — ABNORMAL HIGH (ref 1.7–7.7)
Neutrophils Relative %: 86 %
Platelets: 172 10*3/uL (ref 150–400)
RBC: 4.22 MIL/uL (ref 4.22–5.81)
RDW: 13.4 % (ref 11.5–15.5)
WBC: 17.7 10*3/uL — ABNORMAL HIGH (ref 4.0–10.5)
nRBC: 0 % (ref 0.0–0.2)

## 2021-10-13 LAB — BASIC METABOLIC PANEL
Anion gap: 9 (ref 5–15)
BUN: 9 mg/dL (ref 8–23)
CO2: 22 mmol/L (ref 22–32)
Calcium: 9 mg/dL (ref 8.9–10.3)
Chloride: 103 mmol/L (ref 98–111)
Creatinine, Ser: 0.99 mg/dL (ref 0.61–1.24)
GFR, Estimated: >60 mL/min (ref >60)
Glucose, Bld: 142 mg/dL — ABNORMAL HIGH (ref 70–99)
Potassium: 3.8 mmol/L (ref 3.5–5.1)
Sodium: 134 mmol/L — ABNORMAL LOW (ref 135–145)

## 2021-10-13 LAB — RESP PANEL BY RT-PCR (FLU A&B, COVID) ARPGX2
Influenza A by PCR: NEGATIVE
Influenza B by PCR: NEGATIVE
SARS Coronavirus 2 by RT PCR: NEGATIVE

## 2021-10-13 MED ORDER — SODIUM CHLORIDE 0.9 % IV BOLUS
500.0000 mL | Freq: Once | INTRAVENOUS | Status: AC
  Administered 2021-10-13: 500 mL via INTRAVENOUS

## 2021-10-13 MED ORDER — DOXYCYCLINE HYCLATE 100 MG PO TABS
100.0000 mg | ORAL_TABLET | Freq: Once | ORAL | Status: AC
  Administered 2021-10-13: 100 mg via ORAL
  Filled 2021-10-13: qty 1

## 2021-10-13 MED ORDER — DOXYCYCLINE HYCLATE 100 MG PO CAPS: 100.0000 mg | ORAL_CAPSULE | Freq: Two times a day (BID) | ORAL | 0 refills | Status: AC

## 2021-10-13 MED ORDER — SODIUM CHLORIDE 0.9 % IV SOLN
2.0000 g | Freq: Once | INTRAVENOUS | Status: AC
  Administered 2021-10-13: 2 g via INTRAVENOUS
  Filled 2021-10-13: qty 20

## 2021-10-13 NOTE — ED Triage Notes (Signed)
Pt BIB [facility] for eval of flu like sx. Symptoms x2 weeks. 1000mg  tylenol by EMS for fever of 101.7

## 2021-10-13 NOTE — ED Provider Notes (Signed)
[facility] EMERGENCY DEPARTMENT Provider Note   CSN: [id number] Arrival date & time: 10/13/21  1859     History CC: Cough, fever   Warren Gray is a 65 y.o. male with history of diabetes not on insulin, hypertension, high cholesterol, presented ED with cough, congestion, malaise for the past 7 days.  Patient reports symptoms began a week ago.  He says he has low energy, headache, poor appetite, is also had a persistent cough and some chest discomfort.  EMS gave the patient a milligram of Tylenol for temp 101.39F en route.  HPI     Past Medical History:  Diagnosis Date   Diabetes mellitus without complication (HCC)    High cholesterol    Hypertension     There are no problems to display for this patient.   History reviewed. No pertinent surgical history.     History reviewed. No pertinent family history.  Social History   Tobacco Use   Smoking status: Every Day    Packs/day: 1.00    Types: Cigarettes   Smokeless tobacco: Never  Vaping Use   Vaping Use: Never used  Substance Use Topics   Alcohol use: Yes    Comment: "Sometimes"   Drug use: No    Home Medications Prior to Admission medications   Medication Sig Start Date End Date Taking? Authorizing Provider  doxycycline (VIBRAMYCIN) 100 MG capsule Take 1 capsule (100 mg total) by mouth 2 (two) times daily for 7 days. 10/14/21 10/21/21 Yes Milfred Krammes, Carola Rhine, MD  dicyclomine (BENTYL) 20 MG tablet Take 1 tablet (20 mg total) by mouth 2 (two) times daily. 07/25/21   Domenic Moras, PA-C  doxycycline (VIBRAMYCIN) 100 MG capsule Take 1 capsule (100 mg total) by mouth 2 (two) times daily. 06/16/21   Montine Circle, PA-C  methocarbamol (ROBAXIN) 500 MG tablet Take 1 tablet (500 mg total) by mouth every 8 (eight) hours as needed for muscle spasms. 06/22/21   Maudie Flakes, MD  omeprazole (PRILOSEC) 20 MG capsule Take 20 mg by mouth daily.    [provider]    Allergies    Naproxen,  Sertraline, and Zoloft [sertraline hcl]  Review of Systems   Review of Systems  Constitutional:  Positive for appetite change, chills, fatigue and fever.  Eyes:  Negative for pain and visual disturbance.  Respiratory:  Positive for cough and shortness of breath.   Cardiovascular:  Positive for chest pain. Negative for palpitations.  Gastrointestinal:  Negative for abdominal pain and vomiting.  Genitourinary:  Negative for dysuria and hematuria.  Musculoskeletal:  Positive for arthralgias and myalgias.  Skin:  Negative for color change and rash.  Neurological:  Positive for headaches. Negative for seizures and syncope.  All other systems reviewed and are negative.  Physical Exam Updated Vital Signs BP 133/81 (BP Location: Left Arm)    Pulse (!) 102    Temp (!) 102.1 F (38.9 C) (Oral)    Resp 16    Ht 5\' 7"  (1.702 m)    Wt 73 kg    SpO2 92%    BMI 25.21 kg/m   Physical Exam Constitutional:      General: He is not in acute distress. HENT:     Head: Normocephalic and atraumatic.  Eyes:     Conjunctiva/sclera: Conjunctivae normal.     Pupils: Pupils are equal, round, and reactive to light.  Cardiovascular:     Rate and Rhythm: Normal rate and regular rhythm.  Pulmonary:  Effort: Pulmonary effort is normal. No respiratory distress.     Comments: Rhonchi LLL Abdominal:     General: There is no distension.     Tenderness: There is no abdominal tenderness.  Skin:    General: Skin is warm and dry.  Neurological:     General: No focal deficit present.     Mental Status: He is alert. Mental status is at baseline.  Psychiatric:        Mood and Affect: Mood normal.        Behavior: Behavior normal.    ED Results / Procedures / Treatments   Labs (all labs ordered are listed, but only abnormal results are displayed) Labs Reviewed  BASIC METABOLIC PANEL - Abnormal; Notable for the following components:      Result Value   Sodium 134 (*)    Glucose, Bld 142 (*)    All other  components within normal limits  CBC WITH DIFFERENTIAL/PLATELET - Abnormal; Notable for the following components:   WBC 17.7 (*)    Neutro Abs 15.4 (*)    Abs Immature Granulocytes 0.12 (*)    All other components within normal limits  RESP PANEL BY RT-PCR (FLU A&B, COVID) [id number]    EKG None  Radiology DG Chest 2 View  Result Date: 10/13/2021 CLINICAL DATA:  Flu like symptoms for 2 weeks question pneumonia, history diabetes mellitus, hypertension EXAM: CHEST - 2 VIEW COMPARISON:  09/24/2020 FINDINGS: Normal heart size, mediastinal contours, and pulmonary vascularity. LEFT lower lobe consolidation consistent with pneumonia. Remaining lungs clear. No pleural effusion or pneumothorax. Minor scattered endplate spur formation thoracic spine. IMPRESSION: LEFT lower lobe pneumonia. Electronically Signed   By: Lavonia Dana M.D.   On: 10/13/2021 21:29    Procedures Procedures   Medications Ordered in ED Medications  cefTRIAXone (ROCEPHIN) 2 g in sodium chloride 0.9 % 100 mL IVPB (0 g Intravenous Stopped 10/13/21 2243)  doxycycline (VIBRA-TABS) tablet 100 mg (100 mg Oral Given 10/13/21 2209)  sodium chloride 0.9 % bolus 500 mL (0 mLs Intravenous Stopped 10/13/21 2243)    ED Course  I have reviewed the triage vital signs and the nursing notes.  Pertinent labs & imaging results that were available during my care of the patient were reviewed by me and considered in my medical decision making (see chart for details).  Differential would include viral infection versus bacterial pneumonia versus other.  I personally viewed the patient's x-ray showing left lower lobe infiltrate.  Clinically this would be consistent with community-acquired pneumonia.  We will check his electrolytes, glucose, kidney function with BMP, give a small bolus of fluids, give some Rocephin and doxycycline.  He is not hypoxic.  If there are no alarming abnormalities on his blood work, I anticipate he can be managed as an  outpatient for community.  Clinically doubt sepsis.  His blood pressures with normal limits  WBC 17.  BMP unremarkable.  Feeling better with fluids.  IV antibiotics completed.  No hypoxia.  CURB65 score of 1 for age - okay for outpatient management   Final Clinical Impression(s) / ED Diagnoses Final diagnoses:  Community acquired pneumonia of left lung, unspecified part of lung    Rx / DC Orders ED Discharge Orders          Ordered    doxycycline (VIBRAMYCIN) 100 MG capsule  2 times daily        10/13/21 2318             Octaviano Glow  J, MD 10/13/21 2344

## 2021-10-14 ENCOUNTER — Other Ambulatory Visit: Payer: Self-pay | Admitting: Family Medicine

## 2021-10-19 ENCOUNTER — Encounter (HOSPITAL_COMMUNITY): Payer: Self-pay | Admitting: Emergency Medicine

## 2021-10-19 ENCOUNTER — Emergency Department (HOSPITAL_COMMUNITY)
Admission: EM | Admit: 2021-10-19 | Discharge: 2021-10-19 | Disposition: A | Discharge status: Home / Self Care | Payer: Medicare Other | Attending: Emergency Medicine | Admitting: Emergency Medicine

## 2021-10-19 ENCOUNTER — Other Ambulatory Visit: Payer: Self-pay

## 2021-10-19 ENCOUNTER — Emergency Department (HOSPITAL_COMMUNITY): Payer: Medicare Other

## 2021-10-19 DIAGNOSIS — I10 Essential (primary) hypertension: Secondary | ICD-10-CM | POA: Diagnosis not present

## 2021-10-19 DIAGNOSIS — E119 Type 2 diabetes mellitus without complications: Secondary | ICD-10-CM | POA: Diagnosis not present

## 2021-10-19 DIAGNOSIS — F17210 Nicotine dependence, cigarettes, uncomplicated: Secondary | ICD-10-CM | POA: Insufficient documentation

## 2021-10-19 DIAGNOSIS — T782XXA Anaphylactic shock, unspecified, initial encounter: Secondary | ICD-10-CM

## 2021-10-19 DIAGNOSIS — T7803XA Anaphylactic reaction due to other fish, initial encounter: Secondary | ICD-10-CM | POA: Insufficient documentation

## 2021-10-19 LAB — CBC WITH DIFFERENTIAL/PLATELET
Abs Immature Granulocytes: 0.05 10*3/uL (ref 0.00–0.07)
Basophils Absolute: 0 10*3/uL (ref 0.0–0.1)
Basophils Relative: 0 %
Eosinophils Absolute: 0.1 10*3/uL (ref 0.0–0.5)
Eosinophils Relative: 1 %
HCT: 40.2 % (ref 39.0–52.0)
Hemoglobin: 13.5 g/dL (ref 13.0–17.0)
Immature Granulocytes: 1 %
Lymphocytes Relative: 30 %
Lymphs Abs: 2.2 10*3/uL (ref 0.7–4.0)
MCH: 32.8 pg (ref 26.0–34.0)
MCHC: 33.6 g/dL (ref 30.0–36.0)
MCV: 97.6 fL (ref 80.0–100.0)
Monocytes Absolute: 0.4 10*3/uL (ref 0.1–1.0)
Monocytes Relative: 5 %
Neutro Abs: 4.7 10*3/uL (ref 1.7–7.7)
Neutrophils Relative %: 63 %
Platelets: 336 10*3/uL (ref 150–400)
RBC: 4.12 MIL/uL — ABNORMAL LOW (ref 4.22–5.81)
RDW: 13.1 % (ref 11.5–15.5)
WBC: 7.4 10*3/uL (ref 4.0–10.5)
nRBC: 0 % (ref 0.0–0.2)

## 2021-10-19 LAB — COMPREHENSIVE METABOLIC PANEL
ALT: 39 U/L (ref 0–44)
AST: 34 U/L (ref 15–41)
Albumin: 3.2 g/dL — ABNORMAL LOW (ref 3.5–5.0)
Alkaline Phosphatase: 74 U/L (ref 38–126)
Anion gap: 8 (ref 5–15)
BUN: 18 mg/dL (ref 8–23)
CO2: 24 mmol/L (ref 22–32)
Calcium: 8.7 mg/dL — ABNORMAL LOW (ref 8.9–10.3)
Chloride: 106 mmol/L (ref 98–111)
Creatinine, Ser: 0.75 mg/dL (ref 0.61–1.24)
GFR, Estimated: >60 mL/min (ref >60)
Glucose, Bld: 203 mg/dL — ABNORMAL HIGH (ref 70–99)
Potassium: 3.5 mmol/L (ref 3.5–5.1)
Sodium: 138 mmol/L (ref 135–145)
Total Bilirubin: 0.6 mg/dL (ref 0.3–1.2)
Total Protein: 6.8 g/dL (ref 6.5–8.1)

## 2021-10-19 LAB — LIPASE, BLOOD: Lipase: 44 U/L (ref 11–51)

## 2021-10-19 LAB — TROPONIN I (HIGH SENSITIVITY)
Troponin I (High Sensitivity): 7 ng/L (ref <18)
Troponin I (High Sensitivity): 5 ng/L (ref <18)

## 2021-10-19 MED ORDER — EPINEPHRINE 0.3 MG/0.3ML IJ SOAJ
0.3000 mg | Freq: Once | INTRAMUSCULAR | Status: AC
  Administered 2021-10-19: 16:00:00 0.3 mg via INTRAMUSCULAR
  Filled 2021-10-19: qty 0.3

## 2021-10-19 MED ORDER — FAMOTIDINE IN NACL 20-0.9 MG/50ML-% IV SOLN
20.0000 mg | Freq: Once | INTRAVENOUS | Status: AC
  Administered 2021-10-19: 16:00:00 20 mg via INTRAVENOUS
  Filled 2021-10-19: qty 50

## 2021-10-19 MED ORDER — FAMOTIDINE 20 MG PO TABS: 20.0000 mg | ORAL_TABLET | Freq: Two times a day (BID) | ORAL | 0 refills | Status: AC

## 2021-10-19 MED ORDER — ONDANSETRON HCL 4 MG/2ML IJ SOLN
4.0000 mg | Freq: Once | INTRAMUSCULAR | Status: AC
  Administered 2021-10-19: 16:00:00 4 mg via INTRAVENOUS
  Filled 2021-10-19: qty 2

## 2021-10-19 MED ORDER — DIPHENHYDRAMINE HCL 25 MG PO TABS: 25.0000 mg | ORAL_TABLET | Freq: Three times a day (TID) | ORAL | 0 refills | Status: DC | PRN

## 2021-10-19 MED ORDER — EPINEPHRINE 0.3 MG/0.3ML IJ SOAJ: 0.3000 mg | INTRAMUSCULAR | 0 refills | Status: DC | PRN

## 2021-10-19 NOTE — Discharge Instructions (Addendum)
It was our pleasure taking care of you here in the emergency department  I have written you for a few medications.  Please take the Pepcid and Benadryl as prescribed  I have also written you for an EpiPen.  If you develop worsening time, facial swelling, feels short of breath uses medication and call 911 to proceed to the emergency department for re-evaluation

## 2021-10-19 NOTE — ED Triage Notes (Signed)
Pt BIB EMS from work. He was taken a lunch break, ate a piece of fish an developed a reaction. Itching in both arms, swelling in bot face and tongue. Per EMS he felt tightness in the chest . Denies pain, A&O x4, 50 mg benadryl and 125 mg of Solumedrol administered en route. 12 lead reveals Sinus Rhythm   BP 160/100 HR 96 RR 18 SpO2 97% RA CBG 188

## 2021-10-19 NOTE — ED Provider Notes (Signed)
[facility]-EMERGENCY DEPT Provider Note   CSN: [id number] Arrival date & time: 10/19/21  1518     History Chief Complaint  Patient presents with   Allergic Reaction    Warren Gray is a 65 y.o. male with past medical history significant for diabetes, hypertension who presents for evaluation allergic reaction.  Was at work.  Ate a piece of fish.  No prior history of allergic reaction or anaphylaxis.  Developed itching to bilateral upper extremities.  Felt his face and his tongue was swelling.  Felt short of breath with some chest tightness, some abdominal cramping and some nausea.  No actual emesis.  Was given 50 mg Benadryl, 125 Solu-Medrol in route.  He denies any current pain.  His rash has improved to his upper extremities where he still has some pruritus to his bilateral hands as well a sensation as tongue swelling.  States he is supposed to be on a blood pressure medication, does not remember the name of this however is not taking this over the last week.  No history of angioedema.  No fever, headache, neck pain, back pain, abdominal pain, diarrhea, extremity swelling.  Denies additional aggravating or relieving factors. Just recent completed course of Doxy for CAP. Feels like his cough has improved however still has slight cough.  Aside from HTN no known hx of cardiac issues  History obtained from patient and past medical records.  No interpreter used.  HPI     Past Medical History:  Diagnosis Date   Diabetes mellitus without complication (HCC)    High cholesterol    Hypertension     There are no problems to display for this patient.   History reviewed. No pertinent surgical history.     History reviewed. No pertinent family history.  Social History   Tobacco Use   Smoking status: Every Day    Packs/day: 1.00    Types: Cigarettes   Smokeless tobacco: Never  Vaping Use   Vaping Use: Never used  Substance Use Topics   Alcohol use: Yes     Comment: "Sometimes"   Drug use: No    Home Medications Prior to Admission medications   Medication Sig Start Date End Date Taking? Authorizing Provider  diphenhydrAMINE (BENADRYL) 25 MG tablet Take 1 tablet (25 mg total) by mouth every 8 (eight) hours as needed. 10/19/21  Yes Aastha Dayley A, PA-C  EPINEPHrine 0.3 mg/0.3 mL IJ SOAJ injection Inject 0.3 mg into the muscle as needed for anaphylaxis. 10/19/21  Yes Durwin Davisson A, PA-C  famotidine (PEPCID) 20 MG tablet Take 1 tablet (20 mg total) by mouth 2 (two) times daily. 10/19/21  Yes Mackenzie Lia A, PA-C  dicyclomine (BENTYL) 20 MG tablet Take 1 tablet (20 mg total) by mouth 2 (two) times daily. 07/25/21   Fayrene Helper, PA-C  doxycycline (VIBRAMYCIN) 100 MG capsule Take 1 capsule (100 mg total) by mouth 2 (two) times daily. 06/16/21   Roxy Horseman, PA-C  doxycycline (VIBRAMYCIN) 100 MG capsule Take 1 capsule (100 mg total) by mouth 2 (two) times daily for 7 days. 10/14/21 10/21/21  Terald Sleeper, MD  methocarbamol (ROBAXIN) 500 MG tablet Take 1 tablet (500 mg total) by mouth every 8 (eight) hours as needed for muscle spasms. 06/22/21   Sabas Sous, MD  omeprazole (PRILOSEC) 20 MG capsule Take 20 mg by mouth daily.    [provider]    Allergies    Naproxen, Sertraline, and Zoloft [sertraline hcl]  Review  of Systems   Review of Systems  Constitutional: Negative.   HENT: Negative.    Respiratory:  Positive for cough, chest tightness and shortness of breath. Negative for apnea, choking, wheezing and stridor.   Cardiovascular: Negative.   Gastrointestinal:  Positive for nausea. Negative for abdominal distention, abdominal pain, constipation, diarrhea and vomiting.  Genitourinary: Negative.   Musculoskeletal: Negative.   Skin:  Positive for rash.       Pruritis   Neurological: Negative.   All other systems reviewed and are negative.  Physical Exam Updated Vital Signs BP (!) 138/120    Pulse 70    Temp  98.2 F (36.8 C) (Oral)    Resp 17    Ht  (1.702 m)    Wt 72.6 kg    SpO2 97%    BMI 25.06 kg/m   Physical Exam Vitals and nursing note reviewed.  Constitutional:      General: He is not in acute distress.    Appearance: He is well-developed. He is not ill-appearing, toxic-appearing or diaphoretic.  HENT:     Head: Normocephalic and atraumatic.     Jaw: There is normal jaw occlusion.     Comments:  No lip swelling. Diffuse mild facial swelling. No drooling, dysphagia or trismus    Mouth/Throat:     Lips: Pink.     Mouth: Angioedema present.     Pharynx: Oropharynx is clear. Uvula midline.     Comments: Tongue edema however able to visualize PO. Eyes:     Pupils: Pupils are equal, round, and reactive to light.  Neck:     Trachea: Trachea and phonation normal.     Comments: Full ROM Cardiovascular:     Rate and Rhythm: Normal rate and regular rhythm.     Pulses: Normal pulses.          Radial pulses are 2+ on the right side and 2+ on the left side.     Heart sounds: Normal heart sounds.  Pulmonary:     Effort: Pulmonary effort is normal. No respiratory distress.     Breath sounds: Normal breath sounds and air entry.     Comments: Clear bilaterally, no stridor Abdominal:     General: Bowel sounds are normal. There is no distension.     Palpations: Abdomen is soft.     Tenderness: There is no abdominal tenderness. There is no right CVA tenderness, left CVA tenderness, guarding or rebound.  Musculoskeletal:        General: Normal range of motion.     Cervical back: Full passive range of motion without pain, normal range of motion and neck supple.  Lymphadenopathy:     Cervical: No cervical adenopathy.  Skin:    General: Skin is warm and dry.     Capillary Refill: Capillary refill takes less than 2 seconds.     Findings: Rash present.     Comments: #2 small areas of urticaria #1 right ulnar aspect wrist, #1 right dorsal hand  Neurological:     General: No focal deficit  present.     Mental Status: He is alert and oriented to person, place, and time.    ED Results / Procedures / Treatments   Labs (all labs ordered are listed, but only abnormal results are displayed) Labs Reviewed  CBC WITH DIFFERENTIAL/PLATELET - Abnormal; Notable for the following components:      Result Value   RBC 4.12 (*)    All other components within normal limits  COMPREHENSIVE METABOLIC PANEL - Abnormal; Notable for the following components:   Glucose, Bld 203 (*)    Calcium 8.7 (*)    Albumin 3.2 (*)    All other components within normal limits  LIPASE, BLOOD  TROPONIN I (HIGH SENSITIVITY)  TROPONIN I (HIGH SENSITIVITY)    EKG None  Radiology DG Chest Portable 1 View  Result Date: 10/19/2021 CLINICAL DATA:  Shortness of breath EXAM: PORTABLE CHEST 1 VIEW COMPARISON:  None. FINDINGS: The heart size and mediastinal contours are within normal limits. Persistent left lower lung opacity. The visualized skeletal structures are unremarkable. IMPRESSION: Persistent left lower lung opacity, likely due to left lower lobe pneumonia. No new parenchymal findings. Recommend follow-up in 3-4 weeks to ensure resolution. Electronically Signed   By: Allegra Lai M.D.   On: 10/19/2021 16:41    Procedures .Critical Care Performed by: Linwood Dibbles, PA-C Authorized by: Linwood Dibbles, PA-C   Critical care provider statement:    Critical care time (minutes):  30   Critical care was necessary to treat or prevent imminent or life-threatening deterioration of the following conditions:  Toxidrome (anaphylaxis)   Critical care was time spent personally by me on the following activities:  Development of treatment plan with patient or surrogate, discussions with consultants, evaluation of patient's response to treatment, examination of patient, ordering and review of laboratory studies, ordering and review of radiographic studies, ordering and performing treatments and interventions,  pulse oximetry, re-evaluation of patient's condition and review of old charts   Medications Ordered in ED Medications  EPINEPHrine (EPI-PEN) injection 0.3 mg (0.3 mg Intramuscular Given 10/19/21 1609)  famotidine (PEPCID) IVPB 20 mg premix (20 mg Intravenous New Bag/Given 10/19/21 1614)  ondansetron (ZOFRAN) injection 4 mg (4 mg Intravenous Given 10/19/21 1615)    ED Course  I have reviewed the triage vital signs and the nursing notes.  Pertinent labs & imaging results that were available during my care of the patient were reviewed by me and considered in my medical decision making (see chart for details).  Pleasant 65 year old here for evaluation of allergic reaction.  Occurred after eating a piece of fish, no prior history of seafood allergic reaction or anaphylaxis.  Has been given Solu-Medrol, 50 mg Benadryl PTA.  On arrival he has 2 areas of urticaria however does have some moderate tongue swelling.  No stridor, respiratory distress.  Does admit to some generalized chest tightness as well as some nausea and crampy abdominal pain.  Given symptoms and exam patient given EpiPen, Pepcid.  He is still on day #5 antibiotics for CAP.  Does admit to some cough however denies any chest pain, shortness of breath.  Curb 65-1  Labs and imaging personally reviewed and interpreted:  CBC without leukocytosis Metabolic panel mild hyperglycemia 203 Trop 7>5 Lipase 44 DG chest with persistent PNA, given he is only on day #5 of antibiotics we will have him continue these, recheck with PCP for x-ray in 3-week EKG without ischemia  Patient reassessed.  Feels improved.  Decrease in tongue size.  No urticaria, no respiratory distress.  We will continue to observe.  Feels earlier chest tightness was due to his anaphylaxis, low suspicion for acute ACS, PE, dissection, worsening infectious process. Encouraged completion Doxy for his known PNA.  Patient reassessed.  Back to baseline.  Clear airway.  No evidence  of tongue or lip swelling.  Normotensive.  He is tolerating p.o. intake.  DC home with symptomatic management, EpiPen as needed.  Encouraged  close follow-up, return for new or worsening symptoms.  The patient has been appropriately medically screened and/or stabilized in the ED. I have low suspicion for any other emergent medical condition which would require further screening, evaluation or treatment in the ED or require inpatient management.  Patient is hemodynamically stable and in no acute distress.  Patient able to ambulate in department prior to ED.  Evaluation does not show acute pathology that would require ongoing or additional emergent interventions while in the emergency department or further inpatient treatment.  I have discussed the diagnosis with the patient and answered all questions.  Pain is been managed while in the emergency department and patient has no further complaints prior to discharge.  Patient is comfortable with plan discussed in room and is stable for discharge at this time.  I have discussed strict return precautions for returning to the emergency department.  Patient was encouraged to follow-up with PCP/specialist refer to at discharge.     MDM Rules/Calculators/A&P                             Final Clinical Impression(s) / ED Diagnoses Final diagnoses:  Anaphylaxis, initial encounter    Rx / DC Orders ED Discharge Orders          Ordered    EPINEPHrine 0.3 mg/0.3 mL IJ SOAJ injection  As needed        10/19/21 2018    famotidine (PEPCID) 20 MG tablet  2 times daily        10/19/21 2018    diphenhydrAMINE (BENADRYL) 25 MG tablet  Every 8 hours PRN        10/19/21 2018             Melaysia Streed A, PA-C 10/19/21 2022    Lorre Nick, MD 10/23/21 [phone]

## 2021-11-04 ENCOUNTER — Other Ambulatory Visit: Payer: Self-pay

## 2021-11-04 ENCOUNTER — Ambulatory Visit
Admission: RE | Admit: 2021-11-04 | Discharge: 2021-11-04 | Disposition: A | Discharge status: Home / Self Care | Payer: Medicare HMO | Source: Ambulatory Visit | Attending: Family Medicine | Admitting: Family Medicine

## 2021-11-04 DIAGNOSIS — F17219 Nicotine dependence, cigarettes, with unspecified nicotine-induced disorders: Secondary | ICD-10-CM

## 2021-11-06 ENCOUNTER — Other Ambulatory Visit: Payer: Self-pay

## 2021-11-06 ENCOUNTER — Inpatient Hospital Stay (HOSPITAL_COMMUNITY)
Admission: EM | Admit: 2021-11-06 | Discharge: 2021-11-14 | DRG: 253 | Disposition: A | Discharge status: Home / Self Care | Payer: Medicare HMO | Attending: Internal Medicine | Admitting: Internal Medicine

## 2021-11-06 ENCOUNTER — Encounter (HOSPITAL_COMMUNITY): Payer: Self-pay

## 2021-11-06 DIAGNOSIS — R140 Abdominal distension (gaseous): Secondary | ICD-10-CM | POA: Diagnosis not present

## 2021-11-06 DIAGNOSIS — I70221 Atherosclerosis of native arteries of extremities with rest pain, right leg: Principal | ICD-10-CM | POA: Diagnosis present

## 2021-11-06 DIAGNOSIS — J9811 Atelectasis: Secondary | ICD-10-CM | POA: Diagnosis not present

## 2021-11-06 DIAGNOSIS — R7982 Elevated C-reactive protein (CRP): Secondary | ICD-10-CM | POA: Diagnosis present

## 2021-11-06 DIAGNOSIS — Z91013 Allergy to seafood: Secondary | ICD-10-CM

## 2021-11-06 DIAGNOSIS — F17210 Nicotine dependence, cigarettes, uncomplicated: Secondary | ICD-10-CM | POA: Diagnosis present

## 2021-11-06 DIAGNOSIS — Z20822 Contact with and (suspected) exposure to covid-19: Secondary | ICD-10-CM | POA: Diagnosis present

## 2021-11-06 DIAGNOSIS — L97511 Non-pressure chronic ulcer of other part of right foot limited to breakdown of skin: Secondary | ICD-10-CM | POA: Diagnosis present

## 2021-11-06 DIAGNOSIS — E871 Hypo-osmolality and hyponatremia: Secondary | ICD-10-CM | POA: Diagnosis present

## 2021-11-06 DIAGNOSIS — L03115 Cellulitis of right lower limb: Secondary | ICD-10-CM | POA: Diagnosis present

## 2021-11-06 DIAGNOSIS — E1165 Type 2 diabetes mellitus with hyperglycemia: Secondary | ICD-10-CM | POA: Diagnosis present

## 2021-11-06 DIAGNOSIS — I10 Essential (primary) hypertension: Secondary | ICD-10-CM | POA: Diagnosis present

## 2021-11-06 DIAGNOSIS — D649 Anemia, unspecified: Secondary | ICD-10-CM | POA: Diagnosis present

## 2021-11-06 DIAGNOSIS — E7800 Pure hypercholesterolemia, unspecified: Secondary | ICD-10-CM | POA: Diagnosis present

## 2021-11-06 DIAGNOSIS — R600 Localized edema: Secondary | ICD-10-CM | POA: Diagnosis present

## 2021-11-06 DIAGNOSIS — Z716 Tobacco abuse counseling: Secondary | ICD-10-CM

## 2021-11-06 DIAGNOSIS — Z886 Allergy status to analgesic agent status: Secondary | ICD-10-CM

## 2021-11-06 DIAGNOSIS — E119 Type 2 diabetes mellitus without complications: Secondary | ICD-10-CM

## 2021-11-06 DIAGNOSIS — E11621 Type 2 diabetes mellitus with foot ulcer: Secondary | ICD-10-CM | POA: Diagnosis present

## 2021-11-06 DIAGNOSIS — Z79899 Other long term (current) drug therapy: Secondary | ICD-10-CM

## 2021-11-06 DIAGNOSIS — B001 Herpesviral vesicular dermatitis: Secondary | ICD-10-CM | POA: Diagnosis not present

## 2021-11-06 DIAGNOSIS — R509 Fever, unspecified: Secondary | ICD-10-CM | POA: Diagnosis not present

## 2021-11-06 DIAGNOSIS — R918 Other nonspecific abnormal finding of lung field: Secondary | ICD-10-CM | POA: Diagnosis present

## 2021-11-06 DIAGNOSIS — F17200 Nicotine dependence, unspecified, uncomplicated: Secondary | ICD-10-CM | POA: Diagnosis present

## 2021-11-06 DIAGNOSIS — K219 Gastro-esophageal reflux disease without esophagitis: Secondary | ICD-10-CM | POA: Diagnosis present

## 2021-11-06 DIAGNOSIS — E11628 Type 2 diabetes mellitus with other skin complications: Secondary | ICD-10-CM | POA: Diagnosis present

## 2021-11-06 DIAGNOSIS — D72829 Elevated white blood cell count, unspecified: Secondary | ICD-10-CM | POA: Diagnosis present

## 2021-11-06 DIAGNOSIS — L089 Local infection of the skin and subcutaneous tissue, unspecified: Secondary | ICD-10-CM | POA: Diagnosis present

## 2021-11-06 DIAGNOSIS — E08621 Diabetes mellitus due to underlying condition with foot ulcer: Secondary | ICD-10-CM

## 2021-11-06 DIAGNOSIS — K5900 Constipation, unspecified: Secondary | ICD-10-CM | POA: Diagnosis present

## 2021-11-06 DIAGNOSIS — Z0189 Encounter for other specified special examinations: Secondary | ICD-10-CM

## 2021-11-06 DIAGNOSIS — Z008 Encounter for other general examination: Secondary | ICD-10-CM

## 2021-11-06 DIAGNOSIS — E876 Hypokalemia: Secondary | ICD-10-CM | POA: Diagnosis present

## 2021-11-06 DIAGNOSIS — Z888 Allergy status to other drugs, medicaments and biological substances status: Secondary | ICD-10-CM

## 2021-11-06 DIAGNOSIS — R911 Solitary pulmonary nodule: Secondary | ICD-10-CM | POA: Diagnosis present

## 2021-11-06 DIAGNOSIS — D539 Nutritional anemia, unspecified: Secondary | ICD-10-CM | POA: Diagnosis present

## 2021-11-06 DIAGNOSIS — E538 Deficiency of other specified B group vitamins: Secondary | ICD-10-CM | POA: Diagnosis present

## 2021-11-06 HISTORY — DX: Nicotine dependence, unspecified, uncomplicated: F17.200

## 2021-11-06 HISTORY — DX: Gastro-esophageal reflux disease without esophagitis: K21.9

## 2021-11-06 LAB — CBG MONITORING, ED: Glucose-Capillary: 173 mg/dL — ABNORMAL HIGH (ref 70–99)

## 2021-11-06 NOTE — ED Triage Notes (Signed)
Pt has swelling and redness to his right leg and foot. Pt states that it has been there x 4 days.

## 2021-11-07 ENCOUNTER — Inpatient Hospital Stay (HOSPITAL_COMMUNITY): Payer: Medicare HMO

## 2021-11-07 ENCOUNTER — Encounter (HOSPITAL_COMMUNITY): Payer: Self-pay | Admitting: Internal Medicine

## 2021-11-07 DIAGNOSIS — Z79899 Other long term (current) drug therapy: Secondary | ICD-10-CM | POA: Diagnosis not present

## 2021-11-07 DIAGNOSIS — L97519 Non-pressure chronic ulcer of other part of right foot with unspecified severity: Secondary | ICD-10-CM | POA: Diagnosis not present

## 2021-11-07 DIAGNOSIS — I70221 Atherosclerosis of native arteries of extremities with rest pain, right leg: Secondary | ICD-10-CM | POA: Diagnosis present

## 2021-11-07 DIAGNOSIS — J9811 Atelectasis: Secondary | ICD-10-CM | POA: Diagnosis not present

## 2021-11-07 DIAGNOSIS — L089 Local infection of the skin and subcutaneous tissue, unspecified: Secondary | ICD-10-CM | POA: Diagnosis not present

## 2021-11-07 DIAGNOSIS — K219 Gastro-esophageal reflux disease without esophagitis: Secondary | ICD-10-CM | POA: Diagnosis present

## 2021-11-07 DIAGNOSIS — K5900 Constipation, unspecified: Secondary | ICD-10-CM | POA: Diagnosis present

## 2021-11-07 DIAGNOSIS — L0390 Cellulitis, unspecified: Secondary | ICD-10-CM | POA: Diagnosis not present

## 2021-11-07 DIAGNOSIS — R600 Localized edema: Secondary | ICD-10-CM | POA: Diagnosis present

## 2021-11-07 DIAGNOSIS — D539 Nutritional anemia, unspecified: Secondary | ICD-10-CM | POA: Diagnosis present

## 2021-11-07 DIAGNOSIS — L538 Other specified erythematous conditions: Secondary | ICD-10-CM

## 2021-11-07 DIAGNOSIS — I10 Essential (primary) hypertension: Secondary | ICD-10-CM | POA: Diagnosis present

## 2021-11-07 DIAGNOSIS — L97511 Non-pressure chronic ulcer of other part of right foot limited to breakdown of skin: Secondary | ICD-10-CM | POA: Diagnosis present

## 2021-11-07 DIAGNOSIS — E871 Hypo-osmolality and hyponatremia: Secondary | ICD-10-CM | POA: Diagnosis present

## 2021-11-07 DIAGNOSIS — I70235 Atherosclerosis of native arteries of right leg with ulceration of other part of foot: Secondary | ICD-10-CM | POA: Diagnosis not present

## 2021-11-07 DIAGNOSIS — E11628 Type 2 diabetes mellitus with other skin complications: Secondary | ICD-10-CM

## 2021-11-07 DIAGNOSIS — E538 Deficiency of other specified B group vitamins: Secondary | ICD-10-CM | POA: Diagnosis present

## 2021-11-07 DIAGNOSIS — E7800 Pure hypercholesterolemia, unspecified: Secondary | ICD-10-CM | POA: Diagnosis present

## 2021-11-07 DIAGNOSIS — B001 Herpesviral vesicular dermatitis: Secondary | ICD-10-CM | POA: Diagnosis not present

## 2021-11-07 DIAGNOSIS — L03115 Cellulitis of right lower limb: Secondary | ICD-10-CM | POA: Diagnosis present

## 2021-11-07 DIAGNOSIS — E119 Type 2 diabetes mellitus without complications: Secondary | ICD-10-CM

## 2021-11-07 DIAGNOSIS — Z20822 Contact with and (suspected) exposure to covid-19: Secondary | ICD-10-CM | POA: Diagnosis present

## 2021-11-07 DIAGNOSIS — R7982 Elevated C-reactive protein (CRP): Secondary | ICD-10-CM | POA: Diagnosis present

## 2021-11-07 DIAGNOSIS — R509 Fever, unspecified: Secondary | ICD-10-CM | POA: Diagnosis not present

## 2021-11-07 DIAGNOSIS — Z886 Allergy status to analgesic agent status: Secondary | ICD-10-CM | POA: Diagnosis not present

## 2021-11-07 DIAGNOSIS — M7989 Other specified soft tissue disorders: Secondary | ICD-10-CM

## 2021-11-07 DIAGNOSIS — F17200 Nicotine dependence, unspecified, uncomplicated: Secondary | ICD-10-CM | POA: Diagnosis present

## 2021-11-07 DIAGNOSIS — E1159 Type 2 diabetes mellitus with other circulatory complications: Secondary | ICD-10-CM | POA: Diagnosis not present

## 2021-11-07 DIAGNOSIS — Z888 Allergy status to other drugs, medicaments and biological substances status: Secondary | ICD-10-CM | POA: Diagnosis not present

## 2021-11-07 DIAGNOSIS — R911 Solitary pulmonary nodule: Secondary | ICD-10-CM | POA: Diagnosis present

## 2021-11-07 DIAGNOSIS — R140 Abdominal distension (gaseous): Secondary | ICD-10-CM | POA: Diagnosis not present

## 2021-11-07 DIAGNOSIS — F17210 Nicotine dependence, cigarettes, uncomplicated: Secondary | ICD-10-CM | POA: Diagnosis present

## 2021-11-07 DIAGNOSIS — E1165 Type 2 diabetes mellitus with hyperglycemia: Secondary | ICD-10-CM | POA: Diagnosis present

## 2021-11-07 DIAGNOSIS — E11621 Type 2 diabetes mellitus with foot ulcer: Secondary | ICD-10-CM | POA: Diagnosis present

## 2021-11-07 DIAGNOSIS — E876 Hypokalemia: Secondary | ICD-10-CM | POA: Diagnosis present

## 2021-11-07 LAB — CBC WITH DIFFERENTIAL/PLATELET
Abs Immature Granulocytes: 0.06 10*3/uL (ref 0.00–0.07)
Basophils Absolute: 0 10*3/uL (ref 0.0–0.1)
Basophils Relative: 0 %
Eosinophils Absolute: 0.1 10*3/uL (ref 0.0–0.5)
Eosinophils Relative: 1 %
HCT: 42 % (ref 39.0–52.0)
Hemoglobin: 13.7 g/dL (ref 13.0–17.0)
Immature Granulocytes: 1 %
Lymphocytes Relative: 17 %
Lymphs Abs: 1.7 10*3/uL (ref 0.7–4.0)
MCH: 32.9 pg (ref 26.0–34.0)
MCHC: 32.6 g/dL (ref 30.0–36.0)
MCV: 100.7 fL — ABNORMAL HIGH (ref 80.0–100.0)
Monocytes Absolute: 0.6 10*3/uL (ref 0.1–1.0)
Monocytes Relative: 6 %
Neutro Abs: 7.6 10*3/uL (ref 1.7–7.7)
Neutrophils Relative %: 75 %
Platelets: 180 10*3/uL (ref 150–400)
RBC: 4.17 MIL/uL — ABNORMAL LOW (ref 4.22–5.81)
RDW: 14 % (ref 11.5–15.5)
WBC: 10.1 10*3/uL (ref 4.0–10.5)
nRBC: 0 % (ref 0.0–0.2)

## 2021-11-07 LAB — GLUCOSE, CAPILLARY
Glucose-Capillary: 171 mg/dL — ABNORMAL HIGH (ref 70–99)
Glucose-Capillary: 113 mg/dL — ABNORMAL HIGH (ref 70–99)
Glucose-Capillary: 137 mg/dL — ABNORMAL HIGH (ref 70–99)
Glucose-Capillary: 162 mg/dL — ABNORMAL HIGH (ref 70–99)

## 2021-11-07 LAB — COMPREHENSIVE METABOLIC PANEL
ALT: 34 U/L (ref 0–44)
AST: 31 U/L (ref 15–41)
Albumin: 3.9 g/dL (ref 3.5–5.0)
Alkaline Phosphatase: 92 U/L (ref 38–126)
Anion gap: 11 (ref 5–15)
BUN: 15 mg/dL (ref 8–23)
CO2: 24 mmol/L (ref 22–32)
Calcium: 9.4 mg/dL (ref 8.9–10.3)
Chloride: 99 mmol/L (ref 98–111)
Creatinine, Ser: 0.97 mg/dL (ref 0.61–1.24)
GFR, Estimated: >60 mL/min (ref >60)
Glucose, Bld: 173 mg/dL — ABNORMAL HIGH (ref 70–99)
Potassium: 3.7 mmol/L (ref 3.5–5.1)
Sodium: 134 mmol/L — ABNORMAL LOW (ref 135–145)
Total Bilirubin: 0.9 mg/dL (ref 0.3–1.2)
Total Protein: 7.9 g/dL (ref 6.5–8.1)

## 2021-11-07 LAB — HIV ANTIBODY (ROUTINE TESTING W REFLEX): HIV Screen 4th Generation wRfx: NONREACTIVE

## 2021-11-07 LAB — SEDIMENTATION RATE: Sed Rate: 71 mm/hr — ABNORMAL HIGH (ref 0–16)

## 2021-11-07 LAB — RESP PANEL BY RT-PCR (FLU A&B, COVID) ARPGX2
Influenza A by PCR: NEGATIVE
Influenza B by PCR: NEGATIVE
SARS Coronavirus 2 by RT PCR: NEGATIVE

## 2021-11-07 LAB — PREALBUMIN: Prealbumin: 11.8 mg/dL — ABNORMAL LOW (ref 18–38)

## 2021-11-07 LAB — LACTIC ACID, PLASMA
Lactic Acid, Venous: 1.4 mmol/L (ref 0.5–1.9)
Lactic Acid, Venous: 1.8 mmol/L (ref 0.5–1.9)

## 2021-11-07 LAB — C-REACTIVE PROTEIN: CRP: 17.6 mg/dL — ABNORMAL HIGH (ref <1.0)

## 2021-11-07 LAB — HEMOGLOBIN A1C
Hgb A1c MFr Bld: 6.1 % — ABNORMAL HIGH (ref 4.8–5.6)
Mean Plasma Glucose: 128.37 mg/dL

## 2021-11-07 MED ORDER — DOCUSATE SODIUM 100 MG PO CAPS
100.0000 mg | ORAL_CAPSULE | Freq: Two times a day (BID) | ORAL | Status: DC
  Administered 2021-11-07 – 2021-11-09 (×5): 100 mg via ORAL
  Filled 2021-11-07 (×5): qty 1

## 2021-11-07 MED ORDER — ACETAMINOPHEN 325 MG PO TABS
650.0000 mg | ORAL_TABLET | Freq: Four times a day (QID) | ORAL | Status: DC | PRN
  Administered 2021-11-07 – 2021-11-10 (×5): 650 mg via ORAL
  Filled 2021-11-07 (×7): qty 2

## 2021-11-07 MED ORDER — HYDRALAZINE HCL 20 MG/ML IJ SOLN: 5.0000 mg | INTRAMUSCULAR | Status: DC | PRN

## 2021-11-07 MED ORDER — LORAZEPAM 2 MG/ML IJ SOLN
1.0000 mg | Freq: Once | INTRAMUSCULAR | Status: AC
  Administered 2021-11-07: 1 mg via INTRAVENOUS
  Filled 2021-11-07: qty 1

## 2021-11-07 MED ORDER — INSULIN ASPART 100 UNIT/ML IJ SOLN: 0.0000 [IU] | Freq: Three times a day (TID) | INTRAMUSCULAR | Status: DC

## 2021-11-07 MED ORDER — ONDANSETRON HCL 4 MG PO TABS
4.0000 mg | ORAL_TABLET | Freq: Four times a day (QID) | ORAL | Status: DC | PRN
  Administered 2021-11-10: 4 mg via ORAL
  Filled 2021-11-07: qty 1

## 2021-11-07 MED ORDER — INSULIN ASPART 100 UNIT/ML IJ SOLN
0.0000 [IU] | Freq: Three times a day (TID) | INTRAMUSCULAR | Status: DC
  Administered 2021-11-07: 3 [IU] via SUBCUTANEOUS
  Administered 2021-11-07 – 2021-11-08 (×3): 2 [IU] via SUBCUTANEOUS
  Administered 2021-11-08: 3 [IU] via SUBCUTANEOUS

## 2021-11-07 MED ORDER — BISACODYL 5 MG PO TBEC: 5.0000 mg | DELAYED_RELEASE_TABLET | Freq: Every day | ORAL | Status: DC | PRN

## 2021-11-07 MED ORDER — LACTATED RINGERS IV SOLN: INTRAVENOUS | Status: DC

## 2021-11-07 MED ORDER — SODIUM CHLORIDE 0.9 % IV SOLN
2.0000 g | Freq: Every day | INTRAVENOUS | Status: DC
  Administered 2021-11-07 – 2021-11-10 (×4): 2 g via INTRAVENOUS
  Filled 2021-11-07 (×5): qty 20

## 2021-11-07 MED ORDER — ACETAMINOPHEN 650 MG RE SUPP: 650.0000 mg | Freq: Four times a day (QID) | RECTAL | Status: DC | PRN

## 2021-11-07 MED ORDER — ACETAMINOPHEN 325 MG PO TABS
650.0000 mg | ORAL_TABLET | Freq: Once | ORAL | Status: AC
  Administered 2021-11-07: 650 mg via ORAL
  Filled 2021-11-07: qty 2

## 2021-11-07 MED ORDER — FAMOTIDINE 20 MG PO TABS
20.0000 mg | ORAL_TABLET | Freq: Two times a day (BID) | ORAL | Status: DC
  Administered 2021-11-07 – 2021-11-14 (×14): 20 mg via ORAL
  Filled 2021-11-07 (×14): qty 1

## 2021-11-07 MED ORDER — PIPERACILLIN-TAZOBACTAM 3.375 G IVPB 30 MIN
3.3750 g | Freq: Once | INTRAVENOUS | Status: AC
  Administered 2021-11-07: 3.375 g via INTRAVENOUS
  Filled 2021-11-07: qty 50

## 2021-11-07 MED ORDER — ONDANSETRON HCL 4 MG/2ML IJ SOLN: 4.0000 mg | Freq: Four times a day (QID) | INTRAMUSCULAR | Status: DC | PRN

## 2021-11-07 MED ORDER — VANCOMYCIN HCL 1500 MG/300ML IV SOLN
1500.0000 mg | Freq: Once | INTRAVENOUS | Status: AC
  Administered 2021-11-07: 1500 mg via INTRAVENOUS
  Filled 2021-11-07: qty 300

## 2021-11-07 MED ORDER — MORPHINE SULFATE (PF) 2 MG/ML IV SOLN
2.0000 mg | INTRAVENOUS | Status: DC | PRN
  Filled 2021-11-07: qty 1

## 2021-11-07 MED ORDER — ENOXAPARIN SODIUM 40 MG/0.4ML IJ SOSY
40.0000 mg | PREFILLED_SYRINGE | Freq: Every day | INTRAMUSCULAR | Status: DC
  Administered 2021-11-07 – 2021-11-10 (×3): 40 mg via SUBCUTANEOUS
  Filled 2021-11-07 (×3): qty 0.4

## 2021-11-07 MED ORDER — GADOBUTROL 1 MMOL/ML IV SOLN
7.0000 mL | Freq: Once | INTRAVENOUS | Status: AC | PRN
  Administered 2021-11-07: 7 mL via INTRAVENOUS

## 2021-11-07 MED ORDER — OXYCODONE HCL 5 MG PO TABS
5.0000 mg | ORAL_TABLET | ORAL | Status: DC | PRN
  Administered 2021-11-07 – 2021-11-12 (×15): 5 mg via ORAL
  Filled 2021-11-07 (×15): qty 1

## 2021-11-07 MED ORDER — NICOTINE 14 MG/24HR TD PT24: 14.0000 mg | MEDICATED_PATCH | Freq: Every day | TRANSDERMAL | Status: DC | PRN

## 2021-11-07 MED ORDER — POLYETHYLENE GLYCOL 3350 17 G PO PACK: 17.0000 g | PACK | Freq: Every day | ORAL | Status: DC | PRN

## 2021-11-07 MED ORDER — INSULIN ASPART 100 UNIT/ML IJ SOLN
0.0000 [IU] | Freq: Every day | INTRAMUSCULAR | Status: DC
  Administered 2021-11-08: 2 [IU] via SUBCUTANEOUS

## 2021-11-07 NOTE — Consult Note (Signed)
WOC Nurse Consult Note: Reason for Consult: Right anterior foot full thickness lesion. See also photo taken by ED Provider and uploaded to EHR. Wound type: infectious, trauma Pressure Injury POA: N/A Measurement: Per ED Provider, quarter-sized, round = 2.5cm round with dark center Wound bed:As described above Drainage (amount, consistency, odor)  Periwound:erythema with erythema continuing upwards on LE. Dressing procedure/placement/frequency: Systemic antibiotics have been ordered.  I have provided Nursing with conservative topical are orders for the care of this lesion using a soap and water cleanse (cleansing also between the toes), rinsing, followed by gentle, but thorough drying. Dressing is to be size appropriate piece of folded xeroform gauze (antimicrobial nonadherent) placed over the lesion topped with dry gauze and secured with a few turns of Kerlix roll gauze/paper tape. Twice daily changes.  Heels are to be floated.  WOC nursing team will not follow, but will remain available to this patient, the nursing and medical teams.  Please re-consult if needed. Thanks, Ladona Mow, MSN, RN, GNP, [facility] [place]  Pager# [phone]

## 2021-11-07 NOTE — Progress Notes (Signed)
A consult was received from an ED physician for Vancomycin per pharmacy dosing.  The patient's profile has been reviewed for ht/wt/allergies/indication/available labs.    A one time order has been placed for Vancomycin 1500mg  IV.    Further antibiotics/pharmacy consults should be ordered by admitting physician if indicated.                       Thank you, , PharmD 11/07/2021  2:42 AM

## 2021-11-07 NOTE — Assessment & Plan Note (Addendum)
Not on medication as an outpatient. Hemoglobin A1C of 6.1% this admission. Sugars controlled -Carb modified diet

## 2021-11-07 NOTE — Plan of Care (Signed)
POC initiated 

## 2021-11-07 NOTE — Progress Notes (Signed)
Right lower extremity venous duplex and ABI's have been completed. Preliminary results can be found in CV Proc through chart review.   11/07/21 9:13 AM Olen Cordial RVT

## 2021-11-07 NOTE — Assessment & Plan Note (Addendum)
Lipid panel obtained this admission with LDL of 90.

## 2021-11-07 NOTE — ED Notes (Signed)
No purple man noted. Attempted to call floor x2 without success.

## 2021-11-07 NOTE — Assessment & Plan Note (Signed)
Continue Pepcid  

## 2021-11-07 NOTE — Assessment & Plan Note (Addendum)
Not on medication as an outpatient. Started on PRN hydralazine inpatient -Continue hydralazine PRN

## 2021-11-07 NOTE — H&P (Signed)
History and Physical    Patient: Warren Gray ZOX:[id number] DOB: 1956-05-06 DOA: 11/06/2021 DOS: the patient was seen and examined on 11/07/2021 PCP: Loura Pardon, MD  Patient coming from: Home - lives with wife; NOK: Wife   Chief Complaint: Diabetic foot infection  HPI: Warren Gray is a 66 y.o. male with medical history significant of DM; HTN; and HLD presenting with diabetic foot infection.  He noticed a blister on his foot at work.  Yesterday, he took his boots off and it was red, mild drainage.  It was a small blister but was creating a lot of problems.  He had been feeling ok but his foot and leg started swelling.  +fever to 100.9.  Mild cough.  He had a prior foot infection, no h/o amputation.    ER Course:  Carryover, per Dr. Marlowe Sax:  Patient with diabetes presented with infected right lower extremity diabetic foot ulcer/cellulitis.  He is febrile but no leukocytosis or other signs of sepsis.  Right lower extremity Doppler ordered but cannot be done until daytime.  X-ray negative for osteomyelitis.  He was given Zosyn.     Review of Systems: As mentioned in the history of present illness. All other systems reviewed and are negative. Past Medical History:  Diagnosis Date   Diabetes mellitus without complication (HCC)    GERD (gastroesophageal reflux disease)    High cholesterol    Hypertension    Tobacco dependence    History reviewed. No pertinent surgical history. Social History:  reports that he has been smoking cigarettes. He has a 56.00 pack-year smoking history. He has never used smokeless tobacco. He reports current alcohol use. He reports that he does not use drugs.  Allergies  Allergen Reactions   Naproxen Itching   Sertraline Diarrhea   Zoloft [Sertraline Hcl] Diarrhea    History reviewed. No pertinent family history.  Prior to Admission medications   Medication Sig Start Date End Date Taking? Authorizing Provider  famotidine (PEPCID) 20 MG tablet Take  1 tablet (20 mg total) by mouth 2 (two) times daily. 10/19/21  Yes Henderly, Britni A, PA-C  diphenhydrAMINE (BENADRYL) 25 MG tablet Take 1 tablet (25 mg total) by mouth every 8 (eight) hours as needed. Patient not taking: Reported on 11/07/2021 10/19/21   Henderly, Britni A, PA-C  doxycycline (VIBRA-TABS) 100 MG tablet Take 100 mg by mouth 2 (two) times daily. Patient not taking: Reported on 11/07/2021    [provider]  EPINEPHrine 0.3 mg/0.3 mL IJ SOAJ injection Inject 0.3 mg into the muscle as needed for anaphylaxis. Patient not taking: Reported on 11/07/2021 10/19/21   Nettie Elm, PA-C    Physical Exam: Vitals:   11/06/21 2304 11/07/21 0128 11/07/21 0204 11/07/21 0437  BP: (!) 154/87 (!) 142/68 (!) 141/66 (!) 145/70  Pulse: 99 92 88 73  Resp: _0 Temp:  (!) 100.9 F (38.3 C) (!) 100.9 F (38.3 C) 99.9 F (37.7 C)  TempSrc:  Oral Oral Oral  SpO2: 99% 100% 97% 98%  Weight: 72.6 kg   72.6 kg  Height: _1  (1.702 m)   _2  (1.702 m)   General:  Appears calm and comfortable and is in NAD Eyes:  EOMI, normal lids, iris ENT:  grossly normal hearing, lips & tongue, mmm; mostly absent  dentition Neck:  no LAD, masses or thyromegaly; no carotid bruits Cardiovascular:  RRR, no m/r/g.  Respiratory:   CTA bilaterally with no wheezes/rales/rhonchi.  Normal respiratory effort. Abdomen:  soft, NT, ND Skin:  R foot with what appears to be a superficial ulceration along the dorsal 4th metatarsal region; there is significant surrounding erythema with edema and streaking erythema extending up the leg proximally to just above the knee       Musculoskeletal:  grossly normal tone BUE/BLE, good ROM, no bony abnormality Lower extremity:   Limited foot exam with no ulcerations other than as noted above.  2+ distal pulses. Psychiatric:  grossly normal mood and affect, speech fluent and appropriate, AOx3 Neurologic:  CN 2-12 grossly intact, moves all extremities in  coordinated fashion   Radiological Exams on Admission: Independently reviewed - see discussion in A/P where applicable  DG Foot Complete Right  Result Date: 11/07/2021 CLINICAL DATA:  Swelling, redness.  Evaluate for osteomyelitis. EXAM: RIGHT FOOT COMPLETE - 3+ VIEW COMPARISON:  None. FINDINGS: There is no evidence of fracture or dislocation. There is no evidence of arthropathy or other focal bone abnormality. No bone destruction. Soft tissues are unremarkable. IMPRESSION: Negative. Electronically Signed   By: Rolm Baptise M.D.   On: 11/07/2021 03:09   VAS [place] ABI WITH/WO TBI  Result Date: 11/07/2021  LOWER EXTREMITY DOPPLER STUDY Patient Name:  Warren Gray  Date of Exam:   11/07/2021 Medical Rec #: [id number]     Accession #:    [id number] Date of Birth: [birth year]      Patient Gender: M Patient Age:   57 years Exam Location:  [facility] Procedure:      VAS [place] ABI WITH/WO TBI Referring Phys: Anderson Malta Demontray Franta --------------------------------------------------------------------------------  Indications: Ulceration. High Risk Factors: Hypertension, Diabetes.  Comparison Study: No prior studies. Performing Technologist: Carlos Levering RVT  Examination Guidelines: A complete evaluation includes at minimum, Doppler waveform signals and systolic blood pressure reading at the level of bilateral brachial, anterior tibial, and posterior tibial arteries, when vessel segments are accessible. Bilateral testing is considered an integral part of a complete examination. Photoelectric Plethysmograph (PPG) waveforms and toe systolic pressure readings are included as required and additional duplex testing as needed. Limited examinations for reoccurring indications may be performed as noted.  ABI Findings: +---------+------------------+-----+---------+--------+  Right     Rt Pressure (mmHg) Index Waveform  Comment   +---------+------------------+-----+---------+--------+  Brachial  153                      triphasic            +---------+------------------+-----+---------+--------+  PTA       118                0.77  biphasic            +---------+------------------+-----+---------+--------+  DP        113                0.74  biphasic            +---------+------------------+-----+---------+--------+  Great Toe 72                 0.47                      +---------+------------------+-----+---------+--------+ +---------+------------------+-----+---------+-------+  Left      Lt Pressure (mmHg) Index Waveform  Comment  +---------+------------------+-----+---------+-------+  Brachial  147                      triphasic          +---------+------------------+-----+---------+-------+  PTA  171                1.12  triphasic          +---------+------------------+-----+---------+-------+  DP        160                1.05  triphasic          +---------+------------------+-----+---------+-------+  Great Toe 94                 0.61                     +---------+------------------+-----+---------+-------+ +-------+-----------+-----------+------------+------------+  ABI/TBI Today's ABI Today's TBI Previous ABI Previous TBI  +-------+-----------+-----------+------------+------------+  Right   0.77        0.47                                   +-------+-----------+-----------+------------+------------+  Left    1.12        0.61                                   +-------+-----------+-----------+------------+------------+  Summary: Right: Resting right ankle-brachial index indicates moderate right lower extremity arterial disease. The right toe-brachial index is abnormal. Left: Resting left ankle-brachial index is within normal range. No evidence of significant left lower extremity arterial disease. The left toe-brachial index is abnormal.  *See table(s) above for measurements and observations.     Preliminary    VAS [place] LOWER EXTREMITY VENOUS (DVT) (ONLY [facility] & [facility])  Result Date: 11/07/2021  Lower Venous DVT Study Patient Name:  MARQUAL MI  Date of Exam:   11/07/2021 Medical Rec #: [id number]     Accession #:    [id number] Date of Birth: 06/07/1956      Patient Gender: M Patient Age:   39 years Exam [facility]:  [facility] Procedure:      VAS [place] LOWER EXTREMITY VENOUS (DVT) Referring Phys: Regan Lemming --------------------------------------------------------------------------------  Indications: Swelling, and Erythema.  Risk Factors: None identified. Comparison Study: No prior studies. Performing Technologist: Oliver Hum RVT  Examination Guidelines: A complete evaluation includes B-mode imaging, spectral Doppler, color Doppler, and power Doppler as needed of all accessible portions of each vessel. Bilateral testing is considered an integral part of a complete examination. Limited examinations for reoccurring indications may be performed as noted. The reflux portion of the exam is performed with the patient in reverse Trendelenburg.  +---------+---------------+---------+-----------+----------+--------------+  RIGHT     Compressibility Phasicity Spontaneity Properties Thrombus Aging  +---------+---------------+---------+-----------+----------+--------------+  CFV       Full            Yes       Yes                                    +---------+---------------+---------+-----------+----------+--------------+  SFJ       Full                                                             +---------+---------------+---------+-----------+----------+--------------+  FV Prox  Full                                                             +---------+---------------+---------+-----------+----------+--------------+  FV Mid    Full                                                             +---------+---------------+---------+-----------+----------+--------------+  FV Distal Full                                                             +---------+---------------+---------+-----------+----------+--------------+  PFV       Full                                                              +---------+---------------+---------+-----------+----------+--------------+  POP       Full            Yes       Yes                                    +---------+---------------+---------+-----------+----------+--------------+  PTV       Full                                                             +---------+---------------+---------+-----------+----------+--------------+  PERO      Full                                                             +---------+---------------+---------+-----------+----------+--------------+   +----+---------------+---------+-----------+----------+--------------+  LEFT Compressibility Phasicity Spontaneity Properties Thrombus Aging  +----+---------------+---------+-----------+----------+--------------+  CFV  Full            Yes       Yes                                    +----+---------------+---------+-----------+----------+--------------+     Summary: RIGHT: - There is no evidence of deep vein thrombosis in the lower extremity.  - No cystic structure found in the popliteal fossa.  LEFT: - No evidence of common femoral vein obstruction.  *See table(s) above for measurements and observations.    Preliminary     EKG: Independently reviewed.  NSR with rate 74; no evidence of acute ischemia   Labs on Admission: I have personally reviewed the available labs and imaging studies at the time of the admission.  Pertinent labs:    Glucose 173 Lactate 1.4, 1.8 Unremarkable CBC COVID/flu negative Blood cultures pernding   Assessment/Plan * Diabetic foot infection ([place])- (present on admission) -Patient with blister on R dorsal foot, developed erythema/edema extending up the entire right lower leg c/w cellulitis -Ulcer appears to be superficial but will check MRI to make sure; negative foot xray -Prior foot xray on 9/29 shows mild cortical irregularity at the distal aspect of the second metatarsal head amputation site concerning for  active infection osteitis -[place] in ER negative for DVT -Negative lactate, no current concerns for sepsis -I have ordered ABIs in case revascularization may be indicated -LE wound order set utilized including labs (CRP, ESR, A1c, prealbumin, HIV, and blood cultures) and consults (diabetes coordinator; peripheral vascular navigator; TOC team; wound care; and nutrition) -Will admit for ongoing treatment and monitoring -Will treat with Rocephin per the cellulitis order set for now  Tobacco dependence- (present on admission) -Encourage cessation.   -This was discussed with the patient and should be reviewed on an ongoing basis.   -Patch ordered at patient request.  Hypertension- (present on admission) -He does not appear to be taking medications for this issue at this time -Mildly elevated BP, may need addition of medication -Will cover with prn IV hydralazine for now  High cholesterol- (present on admission) -He does not appear to be taking medications for this issue at this time -Lipids in 2020: 193/28/[place]/1169 -Will recheck fasting lipids -Needs PCP f/u for this issue  GERD (gastroesophageal reflux disease)- (present on admission) -Continue Pepcid  Diabetes mellitus without complication ([place]) -Will check A1c -He appears to not be taking medication for this issue -Cover with [facility]    Advance Care Planning:   Code Status: Full Code   Consults: diabetes coordinator; peripheral vascular navigator; TOC team; wound care; and nutrition  Family Communication: Wife was present throughout evaluation but was asleep the entire visit  Severity of Illness: The appropriate patient status for this patient is INPATIENT. Inpatient status is judged to be reasonable and necessary in order to provide the required intensity of service to ensure the patient's safety. The patient's presenting symptoms, physical exam findings, and initial radiographic and laboratory data in the context of  their chronic comorbidities is felt to place them at high risk for further clinical deterioration. Furthermore, it is not anticipated that the patient will be medically stable for discharge from the hospital within 2 midnights of admission.   * I certify that at the point of admission it is my clinical judgment that the patient will require inpatient hospital care spanning beyond 2 midnights from the point of admission due to high intensity of service, high risk for further deterioration and high frequency of surveillance required.*  Author: Karmen Bongo, MD 11/07/2021 10:49 AM  For on call review [email or web address].

## 2021-11-07 NOTE — Progress Notes (Signed)
Purple man now available. Missed call while counting our 2 pyxix.

## 2021-11-07 NOTE — ED Provider Notes (Signed)
[facility]EMERGENCY DEPT Provider Note   CSN: [id number] Arrival date & time: 11/06/21  2219     History  Chief Complaint  Patient presents with   Leg Swelling   Rash    Warren Gray is a 66 y.o. male.   Rash  66 year old male with a history of diabetes mellitus who presents to the emergency department with concern for a diabetic foot ulcer and infection.  The patient states that over the past 4 days he has developed worsening swelling and redness and pain in his right lower extremity.  The pain has been slowly progressive.  He has an ulceration to the dorsum of his right foot which has surrounding redness.  He endorses fever and chills.  He denies a history of DVT or PE.  Home Medications Prior to Admission medications   Medication Sig Start Date End Date Taking? Authorizing Provider  famotidine (PEPCID) 20 MG tablet Take 1 tablet (20 mg total) by mouth 2 (two) times daily. 10/19/21  Yes Henderly, Britni A, PA-C  diphenhydrAMINE (BENADRYL) 25 MG tablet Take 1 tablet (25 mg total) by mouth every 8 (eight) hours as needed. Patient not taking: Reported on 11/07/2021 10/19/21   Henderly, Britni A, PA-C  doxycycline (VIBRA-TABS) 100 MG tablet Take 100 mg by mouth 2 (two) times daily. Patient not taking: Reported on 11/07/2021    [provider]  EPINEPHrine 0.3 mg/0.3 mL IJ SOAJ injection Inject 0.3 mg into the muscle as needed for anaphylaxis. Patient not taking: Reported on 11/07/2021 10/19/21   Henderly, Britni A, PA-C      Allergies    Naproxen, Sertraline, and Zoloft [sertraline hcl]    Review of Systems   Review of Systems  Skin:  Positive for rash.   Physical Exam Updated Vital Signs BP (!) 145/70 (BP Location: Right Arm)    Pulse 73    Temp 99.9 F (37.7 C) (Oral)    Resp 16    Ht 5\' 7"  (1.702 m)    Wt 72.6 kg    SpO2 98%    BMI 25.06 kg/m  Physical Exam Vitals and nursing note reviewed.  Constitutional:      General: He is not in acute  distress.    Appearance: He is well-developed.  HENT:     Head: Normocephalic and atraumatic.  Eyes:     Conjunctiva/sclera: Conjunctivae normal.  Cardiovascular:     Rate and Rhythm: Normal rate and regular rhythm.     Heart sounds: No murmur heard. Pulmonary:     Effort: Pulmonary effort is normal. No respiratory distress.     Breath sounds: Normal breath sounds.  Abdominal:     Palpations: Abdomen is soft.     Tenderness: There is no abdominal tenderness.  Musculoskeletal:        General: Swelling and tenderness present.     Cervical back: Neck supple.     Comments: Right lower extremity with a small quarter sized ulceration to the dorsum of the right foot, surrounding erythema, mild tenderness to palpation.  Erythema and edema track from the foot up the right lower extremity to the level of the knee.  Skin:    General: Skin is warm and dry.     Capillary Refill: Capillary refill takes less than 2 seconds.     Findings: Erythema present.  Neurological:     General: No focal deficit present.     Mental Status: He is alert. Mental status is at baseline.  Psychiatric:  Mood and Affect: Mood normal.    ED Results / Procedures / Treatments   Labs (all labs ordered are listed, but only abnormal results are displayed) Labs Reviewed  CBC WITH DIFFERENTIAL/PLATELET - Abnormal; Notable for the following components:      Result Value   RBC 4.17 (*)    MCV 100.7 (*)    All other components within normal limits  COMPREHENSIVE METABOLIC PANEL - Abnormal; Notable for the following components:   Sodium 134 (*)    Glucose, Bld 173 (*)    All other components within normal limits  CBG MONITORING, ED - Abnormal; Notable for the following components:   Glucose-Capillary 173 (*)    All other components within normal limits  RESP PANEL BY RT-PCR (FLU A&B, COVID) ARPGX2  CULTURE, BLOOD (ROUTINE X 2)  CULTURE, BLOOD (ROUTINE X 2)  MRSA NEXT GEN BY PCR, NASAL  LACTIC ACID, PLASMA   LACTIC ACID, PLASMA    EKG None  Radiology DG Foot Complete Right  Result Date: 11/07/2021 CLINICAL DATA:  Swelling, redness.  Evaluate for osteomyelitis. EXAM: RIGHT FOOT COMPLETE - 3+ VIEW COMPARISON:  None. FINDINGS: There is no evidence of fracture or dislocation. There is no evidence of arthropathy or other focal bone abnormality. No bone destruction. Soft tissues are unremarkable. IMPRESSION: Negative. Electronically Signed   By: Charlett Nose M.D.   On: 11/07/2021 03:09    Procedures Procedures    Medications Ordered in ED Medications  vancomycin (VANCOREADY) IVPB 1500 mg/300 mL (1,500 mg Intravenous New Bag/Given 11/07/21 0404)  acetaminophen (TYLENOL) tablet 650 mg (650 mg Oral Given 11/07/21 0112)  piperacillin-tazobactam (ZOSYN) IVPB 3.375 g (0 g Intravenous Stopped 11/07/21 0243)    ED Course/ Medical Decision Making/ A&P Clinical Course as of 11/07/21 0515  Mon Nov 07, 2021  0121 Temp: 100.3 F (37.9 C) [JL]  0143 Temp(!): 100.9 F (38.3 C) [JL]    Clinical Course User Index [JL] Ernie Avena, MD                           Medical Decision Making  66 year old male with a history of diabetes mellitus who presents to the emergency department with concern for a diabetic foot ulcer and infection.  The patient states that over the past 4 days he has developed worsening swelling and redness and pain in his right lower extremity.  The pain has been slowly progressive.  He has an ulceration to the dorsum of his right foot which has surrounding redness.  He endorses fever and chills.  He denies a history of DVT or PE.  On arrival, the patient was febrile, mildly elevated heart rate noted to 99, not tachypneic, mildly hypertensive BP 1 5487, saturating 99% on room air.  Cardiac telemetry reviewed revealing normal sinus rhythm.  Patient is presenting with concern for an infected diabetic ulceration in his right foot with developing cellulitis in his right lower extremity.  No  crepitus palpated on physical exam and symptoms have been progressing slowly over the past 4 days.  No concern for necrotizing fasciitis at this time.  Considered DVT on the differential however given the patient's diabetic ulceration, erythema and edema, feel that cellulitis and infected diabetic ulceration is more likely at this time.  DVT ultrasound ordered but is unable to be performed in the emergency department tonight due to lack of available ultrasound technician for DVT study.  Laboratory work-up reviewed significant for normal lactic acid at  1.8, CBC without a leukocytosis WBC 10.1, no significant anemia, mild hyponatremia 134, mild hyperglycemia to 173, normal anion gap and bicarbonate, COVID-19 and influenza PCR testing negative.  X-ray imaging was performed of the right foot which was reviewed by myself and radiology and unremarkable.  IV access was obtained and the patient was administered Tylenol, IV Zosyn and vancomycin per pharmacy consult.  Given the patient's medical comorbidity of diabetes, feel that admission for observation and IV antibiotics is warranted.  As well as medicine consulted for admission and accepted the patient.  Patient was stable at time of admission.    Final Clinical Impression(s) / ED Diagnoses Final diagnoses:  Diabetic ulcer of right midfoot associated with diabetes mellitus due to underlying condition, limited to breakdown of skin (HCC)  Cellulitis of right lower extremity    Rx / DC Orders ED Discharge Orders     None         Ernie AvenaLawsing, Georgina Krist, MD 11/07/21 [phone]

## 2021-11-07 NOTE — Plan of Care (Signed)
?  Problem: Activity: ?Goal: Risk for activity intolerance will decrease ?Outcome: Progressing ?  ?Problem: Safety: ?Goal: Ability to remain free from injury will improve ?Outcome: Progressing ?  ?Problem: Pain Managment: ?Goal: General experience of comfort will improve ?Outcome: Progressing ?  ?

## 2021-11-07 NOTE — Assessment & Plan Note (Addendum)
Tobacco cessation discussed on admission. -Continue nicotine patch

## 2021-11-07 NOTE — Assessment & Plan Note (Addendum)
Right foot with cellulitis extending up lower leg. Started empirically on Ceftriaxone IV.  MRI obtained and was negative for acute osteomyelitis.  RLE venous duplex negative for acute DVT x2.  ABI significant for moderate arterial disease.  Blood cultures obtained on admission and are so far not growing any organism. PT evaluated with recommendation for no follow-up. Had an episode of fever on 1/13. Was on ceftriaxone and then broaden to cefepime and vancomycin. Now on oral omnicef and doxycycline.  Continue to follow blood culture data, no growth so far on  repeat cultures. Underwent angiography with right fempop stenting and angioplasty on 1/13. Anticipating swelling to improve gradually over period of time. Patient will benefit from outpatient Unna boot/compression stockings. Ordered in hospital but they were out of supplies. Pt was provided cam walker boot at his request.

## 2021-11-08 DIAGNOSIS — E1159 Type 2 diabetes mellitus with other circulatory complications: Secondary | ICD-10-CM

## 2021-11-08 LAB — CBC
HCT: 37.6 % — ABNORMAL LOW (ref 39.0–52.0)
Hemoglobin: 12.4 g/dL — ABNORMAL LOW (ref 13.0–17.0)
MCH: 32.7 pg (ref 26.0–34.0)
MCHC: 33 g/dL (ref 30.0–36.0)
MCV: 99.2 fL (ref 80.0–100.0)
Platelets: 160 10*3/uL (ref 150–400)
RBC: 3.79 MIL/uL — ABNORMAL LOW (ref 4.22–5.81)
RDW: 14.1 % (ref 11.5–15.5)
WBC: 11.5 10*3/uL — ABNORMAL HIGH (ref 4.0–10.5)
nRBC: 0 % (ref 0.0–0.2)

## 2021-11-08 LAB — BASIC METABOLIC PANEL
Anion gap: 8 (ref 5–15)
BUN: 10 mg/dL (ref 8–23)
CO2: 28 mmol/L (ref 22–32)
Calcium: 8.6 mg/dL — ABNORMAL LOW (ref 8.9–10.3)
Chloride: 100 mmol/L (ref 98–111)
Creatinine, Ser: 0.88 mg/dL (ref 0.61–1.24)
GFR, Estimated: >60 mL/min (ref >60)
Glucose, Bld: 120 mg/dL — ABNORMAL HIGH (ref 70–99)
Potassium: 3.6 mmol/L (ref 3.5–5.1)
Sodium: 136 mmol/L (ref 135–145)

## 2021-11-08 LAB — LIPID PANEL
Cholesterol: 135 mg/dL (ref 0–200)
HDL: 27 mg/dL — ABNORMAL LOW (ref >40)
LDL Cholesterol: 90 mg/dL (ref 0–99)
Total CHOL/HDL Ratio: 5 RATIO
Triglycerides: 91 mg/dL (ref <150)
VLDL: 18 mg/dL (ref 0–40)

## 2021-11-08 LAB — GLUCOSE, CAPILLARY
Glucose-Capillary: 118 mg/dL — ABNORMAL HIGH (ref 70–99)
Glucose-Capillary: 131 mg/dL — ABNORMAL HIGH (ref 70–99)
Glucose-Capillary: 161 mg/dL — ABNORMAL HIGH (ref 70–99)
Glucose-Capillary: 137 mg/dL — ABNORMAL HIGH (ref 70–99)
Glucose-Capillary: 141 mg/dL — ABNORMAL HIGH (ref 70–99)

## 2021-11-08 MED ORDER — GLUCERNA SHAKE PO LIQD
237.0000 mL | Freq: Two times a day (BID) | ORAL | Status: DC
  Administered 2021-11-10 – 2021-11-13 (×5): 237 mL via ORAL
  Filled 2021-11-08 (×13): qty 237

## 2021-11-08 MED ORDER — ADULT MULTIVITAMIN W/MINERALS CH
1.0000 | ORAL_TABLET | Freq: Every day | ORAL | Status: DC
  Administered 2021-11-08 – 2021-11-14 (×5): 1 via ORAL
  Filled 2021-11-08 (×5): qty 1

## 2021-11-08 MED ORDER — HYDROCERIN EX CREA
TOPICAL_CREAM | Freq: Two times a day (BID) | CUTANEOUS | Status: DC
  Administered 2021-11-08 – 2021-11-09 (×2): 1 via TOPICAL
  Filled 2021-11-08 (×2): qty 113

## 2021-11-08 NOTE — H&P (View-Only) (Signed)
[facility] [place]  Patient name: Warren Gray MRN: [id number] DOB: 04/02/1956 Sex: male   REQUESTING PROVIDER:   Hospitalist   REASON FOR CONSULT:    Right foot wound and cellulitis  HISTORY OF PRESENT ILLNESS:   Warren Gray is a 66 y.o. male, who presented to the hospital yesterday with a right foot infection.  He states this has been going on for several days.  He is a diabetic.  He has been on IV antibiotics.  There has been some drainage around the small blister as well as redness extending up his leg to his knee.  He had an x-ray that was negative for osteomyelitis.  The patient is a poorly controlled diabetic.  He is medically managed for hypercholesterolemia with a statin.  He is also medically managed for hypertension.  He is a smoker.  PAST MEDICAL HISTORY    Past Medical History:  Diagnosis Date   Diabetes mellitus without complication (HCC)    GERD (gastroesophageal reflux disease)    High cholesterol    Hypertension    Tobacco dependence      FAMILY HISTORY   History reviewed. No pertinent family history.  SOCIAL HISTORY:   Social History   Socioeconomic History   Marital status: Single    Spouse name: Not on file   Number of children: Not on file   Years of education: Not on file   Highest education level: Not on file  Occupational History   Occupation: Multimedia programmerCook    Employer: [employer]  Tobacco Use   Smoking status: Every Day    Packs/day: 1.00    Years: 56.00    Pack years: 56.00    Types: Cigarettes   Smokeless tobacco: Never  Vaping Use   Vaping Use: Never used  Substance and Sexual Activity   Alcohol use: Yes    Comment: "Sometimes"   Drug use: No   Sexual activity: Not on file  Other Topics Concern   Not on file  Social History Narrative   Not on file   Social Determinants of Health   Financial Resource Strain: Not on file  Food Insecurity: Not on file   Transportation Needs: Not on file  Physical Activity: Not on file  Stress: Not on file  Social Connections: Not on file  Intimate Partner Violence: Not on file    ALLERGIES:    Allergies  Allergen Reactions   Fish Allergy Swelling   Shellfish Allergy Swelling   Naproxen Itching   Sertraline Diarrhea   Zoloft [Sertraline Hcl] Diarrhea    CURRENT MEDICATIONS:    Current Facility-Administered Medications  Medication Dose Route Frequency Provider Last Rate Last Admin   acetaminophen (TYLENOL) tablet 650 mg  650 mg Oral Q6H PRN Jonah BlueYates, Jennifer, MD   650 mg at 11/08/21 0536   Or   acetaminophen (TYLENOL) suppository 650 mg  650 mg Rectal Q6H PRN Jonah BlueYates, Jennifer, MD       bisacodyl (DULCOLAX) EC tablet 5 mg  5 mg Oral Daily PRN Jonah BlueYates, Jennifer, MD       cefTRIAXone (ROCEPHIN) 2 g in sodium chloride 0.9 % 100 mL IVPB  2 g Intravenous Daily Jonah BlueYates, Jennifer, MD   Stopped at 11/08/21 1356   docusate sodium (COLACE) capsule 100 mg  100 mg Oral BID Jonah BlueYates, Jennifer, MD   100 mg at 11/08/21 0826   enoxaparin (LOVENOX) injection 40 mg  40 mg Subcutaneous Daily Jonah BlueYates, Jennifer, MD   40 mg  at 11/08/21 0827   famotidine (PEPCID) tablet 20 mg  20 mg Oral BID Jonah Blue, MD   20 mg at 11/08/21 0827   feeding supplement (GLUCERNA SHAKE) (GLUCERNA SHAKE) liquid 237 mL  237 mL Oral BID BM Narda Bonds, MD       hydrALAZINE (APRESOLINE) injection 5 mg  5 mg Intravenous Q4H PRN Jonah Blue, MD       hydrocerin (EUCERIN) cream   Topical BID Narda Bonds, MD   Given at 11/08/21 1543   insulin aspart (novoLOG) injection 0-15 Units  0-15 Units Subcutaneous TID WC Jonah Blue, MD   2 Units at 11/08/21 1721   insulin aspart (novoLOG) injection 0-5 Units  0-5 Units Subcutaneous QHS Jonah Blue, MD       morphine 2 MG/ML injection 2 mg  2 mg Intravenous Q2H PRN Jonah Blue, MD       multivitamin with minerals tablet 1 tablet  1 tablet Oral Daily Narda Bonds, MD   1 tablet at  11/08/21 1211   nicotine (NICODERM CQ - dosed in mg/24 hours) patch 14 mg  14 mg Transdermal Daily PRN Jonah Blue, MD       ondansetron [facility]) tablet 4 mg  4 mg Oral Q6H PRN Jonah Blue, MD       Or   ondansetron [facility]) injection 4 mg  4 mg Intravenous Q6H PRN Jonah Blue, MD       oxyCODONE (Oxy IR/ROXICODONE) immediate release tablet 5 mg  5 mg Oral Q4H PRN Jonah Blue, MD   5 mg at 11/08/21 1721   polyethylene glycol (MIRALAX / GLYCOLAX) packet 17 g  17 g Oral Daily PRN Jonah Blue, MD        REVIEW OF SYSTEMS:   [X]  denotes positive finding, [ ]  denotes negative finding Cardiac  Comments:  Chest pain or chest pressure:    Shortness of breath upon exertion:    Short of breath when lying flat:    Irregular heart rhythm:        Vascular    Pain in calf, thigh, or hip brought on by ambulation:    Pain in feet at night that wakes you up from your sleep:     Blood clot in your veins:    Leg swelling:         Pulmonary    Oxygen at home:    Productive cough:     Wheezing:         Neurologic    Sudden weakness in arms or legs:     Sudden numbness in arms or legs:     Sudden onset of difficulty speaking or slurred speech:    Temporary loss of vision in one eye:     Problems with dizziness:         Gastrointestinal    Blood in stool:      Vomited blood:         Genitourinary    Burning when urinating:     Blood in urine:        Psychiatric    Major depression:         Hematologic    Bleeding problems:    Problems with blood clotting too easily:        Skin    Rashes or ulcers: x       Constitutional    Fever or chills:     PHYSICAL EXAM:   Vitals:   11/07/21 2137 11/08/21  3151 11/08/21 1405 11/08/21 2122  BP: 123/62 134/68 138/71 136/77  Pulse: 82 86 77 86  Resp: 17 17 18 17   Temp: (!) 100.5 F (38.1 C) (!) 100.8 F (38.2 C) 98.7 F (37.1 C) (!) 101.2 F (38.4 C)  TempSrc: Oral Oral Oral Oral  SpO2: 97% 98% 98% 96%  Weight:       Height:        GENERAL: The patient is a well-nourished male, in no acute distress. The vital signs are documented above. CARDIAC: There is a regular rate and rhythm.  VASCULAR: Palpable right posterior tibial pulse with erythema and edema PULMONARY: Nonlabored respirations MUSCULOSKELETAL: There are no major deformities or cyanosis. NEUROLOGIC: No focal weakness or paresthesias are detected. SKIN: See photo below PSYCHIATRIC: The patient has a normal affect.   STUDIES:   I have reviewed his MRI with the following findings:  1. Motion degraded exam. 2. Soft tissue edema with skin thickening and enhancement at the posterior aspect of the lower leg and ankle most consistent with cellulitis. No organized fluid collections. 3. No evidence of acute osteomyelitis. 4. Mild distal Achilles tendinosis with peritendinitis.     ABI/TBI Today's ABI Today's TBI Previous ABI Previous TBI   +-------+-----------+-----------+------------+------------+   Right   0.77        0.47                                    +-------+-----------+-----------+------------+------------+   Left    1.12        0.61                                    +-------+-----------+-----------+------------+------------+  ASSESSMENT and PLAN   Right diabetic foot infection: The patient has a wound on the dorsum of his foot with cellulitis extending up his leg to his knee.  I can feel a palpable posterior tibial pulse.  However, his infection does not appear to be improving.  I discussed with the patient and his wife, that the neck step would be to proceed with angiography via a left femoral approach and intervention of the right leg if indicated.  This will need to be done at [facility].  We will get him transferred over tomorrow.  Based on the findings of the study, he may be able to return to [facility], which is the preference of his wife.  He will need to be n.p.o. after 6 AM tomorrow morning.  All questions were  answered.   [facility], MD, FACS [facility] [facility] [phone] Pager [phone]

## 2021-11-08 NOTE — Hospital Course (Signed)
Warren Gray is a 66 y.o. male with a history of diabetes mellitus type 2, hypertension, hyperlipidemia. Patient presented secondary to a foot infection and found to have evidence of a diabetic foot infection with cellulitis. MRI negative for osteomyelitis. Ceftriaxone IV initiated. ABIs consistent with vascular disease of the right leg.

## 2021-11-08 NOTE — Plan of Care (Signed)
  Problem: Activity: Goal: Risk for activity intolerance will decrease Outcome: Progressing   Problem: Pain Managment: Goal: General experience of comfort will improve Outcome: Progressing   Problem: Safety: Goal: Ability to remain free from injury will improve Outcome: Progressing   

## 2021-11-08 NOTE — Evaluation (Signed)
Physical Therapy Evaluation Patient Details Name: Warren Gray MRN: [id number] DOB: [birth year] Today's Date: 11/08/2021  History of Present Illness  66 yo male admitted with diabetic foot infection/cellulitis. Hx of DM.  Clinical Impression  On eval, pt required Min guard assist for mobility. He walked ~20 feet with a RW. Ambulation distance is limited by pain-pt rated 10/10. Will plan to follow during hospital stay.        Recommendations for follow up therapy are one component of a multi-disciplinary discharge planning process, led by the attending physician.  Recommendations may be updated based on patient status, additional functional criteria and insurance authorization.  Follow Up Recommendations No PT follow up    Assistance Recommended at Discharge Intermittent Supervision/Assistance  Patient can return home with the following  Help with stairs or ramp for entrance    Equipment Recommendations Rolling walker (2 wheels)  Recommendations for Other Services       Functional Status Assessment Patient has had a recent decline in their functional status and demonstrates the ability to make significant improvements in function in a reasonable and predictable amount of time.     Precautions / Restrictions Restrictions Weight Bearing Restrictions: No      Mobility  Bed Mobility Overal bed mobility: Modified Independent                  Transfers Overall transfer level: Needs assistance Equipment used: Rolling walker (2 wheels) Transfers: Sit to/from Stand Sit to Stand: Supervision;From elevated surface           General transfer comment: Cues for safety, technique, hand placement.    Ambulation/Gait Ambulation/Gait assistance: Min guard Gait Distance (Feet): 20 Feet Assistive device: Rolling walker (2 wheels) Gait Pattern/deviations: Step-to pattern;Antalgic       General Gait Details: Pt unable to tolerate full WBing on R LE. Cues for safety,  technique, sequencing. Distance limited by pain  Stairs            Wheelchair Mobility    Modified Rankin (Stroke Patients Only)       Balance Overall balance assessment: Needs assistance         Standing balance support: Bilateral upper extremity supported;Reliant on assistive device for balance Standing balance-Leahy Scale: Fair                               Pertinent Vitals/Pain Pain Assessment: 0-10 Pain Score: 10-Worst pain ever Pain Location: R LE Pain Descriptors / Indicators: Discomfort;Sore;Grimacing;Guarding Pain Intervention(s): Monitored during session;Repositioned    Home Living Family/patient expects to be discharged to:: Private residence Living Arrangements: Spouse/significant other               Home Equipment: None      Prior Function Prior Level of Function : Independent/Modified Independent             Mobility Comments: works.       Hand Dominance        Extremity/Trunk Assessment   Upper Extremity Assessment Upper Extremity Assessment: Overall WFL for tasks assessed    Lower Extremity Assessment Lower Extremity Assessment: Generalized weakness    Cervical / Trunk Assessment Cervical / Trunk Assessment: Normal  Communication   Communication: No difficulties  Cognition Arousal/Alertness: Awake/alert Behavior During Therapy: WFL for tasks assessed/performed Overall Cognitive Status: Within Functional Limits for tasks assessed  General Comments: some cursing when he gets into it with his wife. otherwise, he was pleasant and cooperative        General Comments      Exercises     Assessment/Plan    PT Assessment Patient needs continued PT services  PT Problem List Decreased mobility;Decreased range of motion;Decreased activity tolerance;Decreased balance;Decreased knowledge of use of DME;Pain       PT Treatment Interventions DME instruction;Gait  training;Therapeutic activities;Therapeutic exercise;Patient/family education;Balance training;Functional mobility training    PT Goals (Current goals can be found in the Care Plan section)  Acute Rehab PT Goals Patient Stated Goal: less pain PT Goal Formulation: With patient Time For Goal Achievement: 11/22/21 Potential to Achieve Goals: Good    Frequency Min 3X/week     Co-evaluation               AM-PAC PT "6 Clicks" Mobility  Outcome Measure Help needed turning from your back to your side while in a flat bed without using bedrails?: None Help needed moving from lying on your back to sitting on the side of a flat bed without using bedrails?: None Help needed moving to and from a bed to a chair (including a wheelchair)?: A Little Help needed standing up from a chair using your arms (e.g., wheelchair or bedside chair)?: A Little Help needed to walk in hospital room?: A Little Help needed climbing 3-5 steps with a railing? : A Little 6 Click Score: 20    End of Session   Activity Tolerance: Patient limited by pain Patient left: in bed;with call bell/phone within reach;with family/visitor present   PT Visit Diagnosis: Pain;Difficulty in walking, not elsewhere classified (R26.2) Pain - Right/Left: Right Pain - part of body: Leg;Ankle and joints of foot    Time: 9381-0175 PT Time Calculation (min) (ACUTE ONLY): 13 min   Charges:   PT Evaluation $PT Eval Moderate Complexity: 1 Mod            Faye Ramsay, PT Acute Rehabilitation  Office: [phone] Pager: [phone]

## 2021-11-08 NOTE — Consult Note (Signed)
° °[facility] [place] ° °Patient name: Warren Gray MRN: [id number] DOB: 01/24/1956 Sex: male ° ° °REQUESTING PROVIDER:  ° °Hospitalist ° ° °REASON FOR CONSULT:  °  °Right foot wound and cellulitis ° °HISTORY OF PRESENT ILLNESS:  ° °Warren Gray is a 66 y.o. male, who presented to the hospital yesterday with a right foot infection.  He states this has been going on for several days.  He is a diabetic.  He has been on IV antibiotics.  There has been some drainage around the small blister as well as redness extending up his leg to his knee.  He had an x-ray that was negative for osteomyelitis. ° °The patient is a poorly controlled diabetic.  He is medically managed for hypercholesterolemia with a statin.  He is also medically managed for hypertension.  He is a smoker. ° °PAST MEDICAL HISTORY  ° ° °Past Medical History:  °Diagnosis Date  ° Diabetes mellitus without complication (HCC)   ° GERD (gastroesophageal reflux disease)   ° High cholesterol   ° Hypertension   ° Tobacco dependence   ° ° ° °FAMILY HISTORY  ° °History reviewed. No pertinent family history. ° °SOCIAL HISTORY:  ° °Social History  ° °Socioeconomic History  ° Marital status: Single  °  Spouse name: Not on file  ° Number of children: Not on file  ° Years of education: Not on file  ° Highest education level: Not on file  °Occupational History  ° Occupation: Cook  °  Employer: [employer]  °Tobacco Use  ° Smoking status: Every Day  °  Packs/day: 1.00  °  Years: 56.00  °  Pack years: 56.00  °  Types: Cigarettes  ° Smokeless tobacco: Never  °Vaping Use  ° Vaping Use: Never used  °Substance and Sexual Activity  ° Alcohol use: Yes  °  Comment: "Sometimes"  ° Drug use: No  ° Sexual activity: Not on file  °Other Topics Concern  ° Not on file  °Social History Narrative  ° Not on file  ° °Social Determinants of Health  ° °Financial Resource Strain: Not on file  °Food Insecurity: Not on file   °Transportation Needs: Not on file  °Physical Activity: Not on file  °Stress: Not on file  °Social Connections: Not on file  °Intimate Partner Violence: Not on file  ° ° °ALLERGIES:  ° ° °Allergies  °Allergen Reactions  ° Fish Allergy Swelling  ° Shellfish Allergy Swelling  ° Naproxen Itching  ° Sertraline Diarrhea  ° Zoloft [Sertraline Hcl] Diarrhea  ° ° °CURRENT MEDICATIONS:  ° ° °Current Facility-Administered Medications  °Medication Dose Route Frequency Provider Last Rate Last Admin  ° acetaminophen (TYLENOL) tablet 650 mg  650 mg Oral Q6H PRN Yates, Jennifer, MD   650 mg at 11/08/21 0536  ° Or  ° acetaminophen (TYLENOL) suppository 650 mg  650 mg Rectal Q6H PRN Yates, Jennifer, MD      ° bisacodyl (DULCOLAX) EC tablet 5 mg  5 mg Oral Daily PRN Yates, Jennifer, MD      ° cefTRIAXone (ROCEPHIN) 2 g in sodium chloride 0.9 % 100 mL IVPB  2 g Intravenous Daily Yates, Jennifer, MD   Stopped at 11/08/21 1356  ° docusate sodium (COLACE) capsule 100 mg  100 mg Oral BID Yates, Jennifer, MD   100 mg at 11/08/21 0826  ° enoxaparin (LOVENOX) injection 40 mg  40 mg Subcutaneous Daily Yates, Jennifer, MD   40 mg   at 11/08/21 0827   famotidine (PEPCID) tablet 20 mg  20 mg Oral BID Jonah Blue, MD   20 mg at 11/08/21 0827   feeding supplement (GLUCERNA SHAKE) (GLUCERNA SHAKE) liquid 237 mL  237 mL Oral BID BM Narda Bonds, MD       hydrALAZINE (APRESOLINE) injection 5 mg  5 mg Intravenous Q4H PRN Jonah Blue, MD       hydrocerin (EUCERIN) cream   Topical BID Narda Bonds, MD   Given at 11/08/21 1543   insulin aspart (novoLOG) injection 0-15 Units  0-15 Units Subcutaneous TID WC Jonah Blue, MD   2 Units at 11/08/21 1721   insulin aspart (novoLOG) injection 0-5 Units  0-5 Units Subcutaneous QHS Jonah Blue, MD       morphine 2 MG/ML injection 2 mg  2 mg Intravenous Q2H PRN Jonah Blue, MD       multivitamin with minerals tablet 1 tablet  1 tablet Oral Daily Narda Bonds, MD   1 tablet at  11/08/21 1211   nicotine (NICODERM CQ - dosed in mg/24 hours) patch 14 mg  14 mg Transdermal Daily PRN Jonah Blue, MD       ondansetron [facility]) tablet 4 mg  4 mg Oral Q6H PRN Jonah Blue, MD       Or   ondansetron [facility]) injection 4 mg  4 mg Intravenous Q6H PRN Jonah Blue, MD       oxyCODONE (Oxy IR/ROXICODONE) immediate release tablet 5 mg  5 mg Oral Q4H PRN Jonah Blue, MD   5 mg at 11/08/21 1721   polyethylene glycol (MIRALAX / GLYCOLAX) packet 17 g  17 g Oral Daily PRN Jonah Blue, MD        REVIEW OF SYSTEMS:   [X]  denotes positive finding, [ ]  denotes negative finding Cardiac  Comments:  Chest pain or chest pressure:    Shortness of breath upon exertion:    Short of breath when lying flat:    Irregular heart rhythm:        Vascular    Pain in calf, thigh, or hip brought on by ambulation:    Pain in feet at night that wakes you up from your sleep:     Blood clot in your veins:    Leg swelling:         Pulmonary    Oxygen at home:    Productive cough:     Wheezing:         Neurologic    Sudden weakness in arms or legs:     Sudden numbness in arms or legs:     Sudden onset of difficulty speaking or slurred speech:    Temporary loss of vision in one eye:     Problems with dizziness:         Gastrointestinal    Blood in stool:      Vomited blood:         Genitourinary    Burning when urinating:     Blood in urine:        Psychiatric    Major depression:         Hematologic    Bleeding problems:    Problems with blood clotting too easily:        Skin    Rashes or ulcers: x       Constitutional    Fever or chills:     PHYSICAL EXAM:   Vitals:   11/07/21 2137 11/08/21  3151 11/08/21 1405 11/08/21 2122  BP: 123/62 134/68 138/71 136/77  Pulse: 82 86 77 86  Resp: 17 17 18 17   Temp: (!) 100.5 F (38.1 C) (!) 100.8 F (38.2 C) 98.7 F (37.1 C) (!) 101.2 F (38.4 C)  TempSrc: Oral Oral Oral Oral  SpO2: 97% 98% 98% 96%  Weight:       Height:        GENERAL: The patient is a well-nourished male, in no acute distress. The vital signs are documented above. CARDIAC: There is a regular rate and rhythm.  VASCULAR: Palpable right posterior tibial pulse with erythema and edema PULMONARY: Nonlabored respirations MUSCULOSKELETAL: There are no major deformities or cyanosis. NEUROLOGIC: No focal weakness or paresthesias are detected. SKIN: See photo below PSYCHIATRIC: The patient has a normal affect.   STUDIES:   I have reviewed his MRI with the following findings:  1. Motion degraded exam. 2. Soft tissue edema with skin thickening and enhancement at the posterior aspect of the lower leg and ankle most consistent with cellulitis. No organized fluid collections. 3. No evidence of acute osteomyelitis. 4. Mild distal Achilles tendinosis with peritendinitis.     ABI/TBI Today's ABI Today's TBI Previous ABI Previous TBI   +-------+-----------+-----------+------------+------------+   Right   0.77        0.47                                    +-------+-----------+-----------+------------+------------+   Left    1.12        0.61                                    +-------+-----------+-----------+------------+------------+  ASSESSMENT and PLAN   Right diabetic foot infection: The patient has a wound on the dorsum of his foot with cellulitis extending up his leg to his knee.  I can feel a palpable posterior tibial pulse.  However, his infection does not appear to be improving.  I discussed with the patient and his wife, that the neck step would be to proceed with angiography via a left femoral approach and intervention of the right leg if indicated.  This will need to be done at [facility].  We will get him transferred over tomorrow.  Based on the findings of the study, he may be able to return to [facility], which is the preference of his wife.  He will need to be n.p.o. after 6 AM tomorrow morning.  All questions were  answered.   [facility], MD, FACS [facility] [facility] [phone] Pager [phone]

## 2021-11-08 NOTE — Progress Notes (Signed)
PROGRESS NOTE    Warren Gray  [id number] DOB: [birth year] DOA: 11/06/2021 PCP: Loura Pardon, MD   Brief Narrative: Warren Gray is a 66 y.o. male with a history of diabetes mellitus type 2, hypertension, hyperlipidemia. Patient presented secondary to a foot infection and found to have evidence of a diabetic foot infection with cellulitis. MRI negative for osteomyelitis. Ceftriaxone IV initiated. ABIs consistent with vascular disease of the right leg.   Assessment & Plan:   * Diabetic foot infection ([place])- (present on admission) Right foot with cellulitis extending up lower leg. Started empirically on Ceftriaxone IV. CRP of 17.6 on admission. MRI obtained and was negative for acute osteomyelitis. RLE venous duplex negative for acute DVT. ABI significant for moderate arterial disease. Blood cultures obtained on admission and are pending. Febrile overnight. PT evaluated with recommendation for no follow-up. -Continue Ceftriaxone IV -Continue to follow blood culture data -Vascular surgery consult (1/10) for arterial insufficiency  Tobacco dependence- (present on admission) Tobacco cessation discussed on admission. -Continue nicotine patch  Hypertension- (present on admission) Not on medication as an outpatient. Started on PRN hydralazine inpatient -Continue hydralazine PRN  High cholesterol- (present on admission) Lipid panel obtained this admission with LDL of 90.  GERD (gastroesophageal reflux disease)- (present on admission) -Continue Pepcid  Diabetes mellitus type 2, controlled ([place]) Not on medication as an outpatient. Hemoglobin A1C of 6.1% this admission. -Carb modified diet -SSI      DVT prophylaxis: Lovenox Code Status:   Code Status: Full Code Family Communication: Wife at bedside Disposition Plan: Discharge home likely in 1-3 days pending no afebrile x24-48 hours, transition to outpatient antibiotic regimen, vascular surgery recommendations, blood culture  data   Consultants:  Vascular surgery  Procedures:  None  Antimicrobials: Ceftriaxone IV    Subjective: Pain of right foot. Otherwise, no concerns.  Objective: Vitals:   11/07/21 0437 11/07/21 1335 11/07/21 2137 11/08/21 0516  BP: (!) 145/70 (!) 160/75 123/62 134/68  Pulse: 73 98 82 86  Resp: 16 18 17 17   Temp: 99.9 F (37.7 C) 98.9 F (37.2 C) (!) 100.5 F (38.1 C) (!) 100.8 F (38.2 C)  TempSrc: Oral Oral Oral Oral  SpO2: 98% 97% 97% 98%  Weight: 72.6 kg     Height: 5\' 7"  (1.702 m)       Intake/Output Summary (Last 24 hours) at 11/08/2021 1315 Last data filed at 11/08/2021 0540 Gross per 24 hour  Intake 232.5 ml  Output 950 ml  Net -717.5 ml   Filed Weights   11/06/21 2304 11/07/21 0437  Weight: 72.6 kg 72.6 kg    Examination:  General exam: Appears calm and comfortable Respiratory system: Clear to auscultation. Respiratory effort normal. Cardiovascular system: S1 & S2 heard, RRR. Gastrointestinal system: Abdomen is nondistended, soft and nontender. No organomegaly or masses felt. Normal bowel sounds heard. Central nervous system: Alert and oriented. No focal neurological deficits. Musculoskeletal: Right lower leg with faint erythema and some swelling. Right foot in gauze wrap Skin: No cyanosis. Psychiatry: Judgement and insight appear normal. Mood & affect appropriate.     Data Reviewed: I have personally reviewed following labs and imaging studies  CBC Lab Results  Component Value Date   WBC 11.5 (H) 11/08/2021   RBC 3.79 (L) 11/08/2021   HGB 12.4 (L) 11/08/2021   HCT 37.6 (L) 11/08/2021   MCV 99.2 11/08/2021   MCH 32.7 11/08/2021   PLT 160 11/08/2021   MCHC 33.0 11/08/2021   RDW 14.1 11/08/2021   LYMPHSABS 1.7  11/06/2021   MONOABS 0.6 11/06/2021   EOSABS 0.1 11/06/2021   BASOSABS 0.0 123456     Last metabolic panel Lab Results  Component Value Date   NA 136 11/08/2021   K 3.6 11/08/2021   CL 100 11/08/2021   CO2 28 11/08/2021    BUN 10 11/08/2021   CREATININE 0.88 11/08/2021   GLUCOSE 120 (H) 11/08/2021   GFRNONAA >60 11/08/2021   GFRAA >60 04/25/2018   CALCIUM 8.6 (L) 11/08/2021   PROT 7.9 11/06/2021   ALBUMIN 3.9 11/06/2021   BILITOT 0.9 11/06/2021   ALKPHOS 92 11/06/2021   AST 31 11/06/2021   ALT 34 11/06/2021   ANIONGAP 8 11/08/2021    CBG (last 3)  Recent Labs    11/08/21 0514 11/08/21 0751 11/08/21 1155  GLUCAP 118* 131* 161*     GFR: Estimated Creatinine Clearance: 77.2 mL/min (by C-G formula based on SCr of 0.88 mg/dL).   Recent Results (from the past 240 hour(s))  Resp Panel by RT-PCR (Flu A&B, Covid) Nasopharyngeal Swab     Status: None   Collection Time: 11/06/21 11:10 PM   Specimen: Nasopharyngeal Swab; Nasopharyngeal(NP) swabs in vial transport medium  Result Value Ref Range Status   SARS Coronavirus 2 by RT PCR NEGATIVE NEGATIVE Final    Comment: (NOTE) SARS-CoV-2 target nucleic acids are NOT DETECTED.  The SARS-CoV-2 RNA is generally detectable in upper respiratory specimens during the acute phase of infection. The lowest concentration of SARS-CoV-2 viral copies this assay can detect is 138 copies/mL. A negative result does not preclude SARS-Cov-2 infection and should not be used as the sole basis for treatment or other patient management decisions. A negative result may occur with  improper specimen collection/handling, submission of specimen other than nasopharyngeal swab, presence of viral mutation(s) within the areas targeted by this assay, and inadequate number of viral copies(<138 copies/mL). A negative result must be combined with clinical observations, patient history, and epidemiological information. The expected result is Negative.  Fact Sheet for Patients:  EntrepreneurPulse.com.au  Fact Sheet for Healthcare Providers:  IncredibleEmployment.be  This test is no t yet approved or cleared by the [place] FDA and  has  been authorized for detection and/or diagnosis of SARS-CoV-2 by FDA under an Emergency Use Authorization (EUA). This EUA will remain  in effect (meaning this test can be used) for the duration of the COVID-19 declaration under Section 564(b)(1) of the Act, 21 U.S.C.section 360bbb-3(b)(1), unless the authorization is terminated  or revoked sooner.       Influenza A by PCR NEGATIVE NEGATIVE Final   Influenza B by PCR NEGATIVE NEGATIVE Final    Comment: (NOTE) The Xpert Xpress SARS-CoV-2/FLU/RSV plus assay is intended as an aid in the diagnosis of influenza from Nasopharyngeal swab specimens and should not be used as a sole basis for treatment. Nasal washings and aspirates are unacceptable for Xpert Xpress SARS-CoV-2/FLU/RSV testing.  Fact Sheet for Patients: EntrepreneurPulse.com.au  Fact Sheet for Healthcare Providers: IncredibleEmployment.be  This test is not yet approved or cleared by the [place] FDA and has been authorized for detection and/or diagnosis of SARS-CoV-2 by FDA under an Emergency Use Authorization (EUA). This EUA will remain in effect (meaning this test can be used) for the duration of the COVID-19 declaration under Section 564(b)(1) of the Act, 21 U.S.C. section 360bbb-3(b)(1), unless the authorization is terminated or revoked.  Performed at [facility], [place] [address], [place], [place] [address]   Blood culture (routine x 2)  Status: None (Preliminary result)   Collection Time: 11/06/21 11:23 PM   Specimen: BLOOD  Result Value Ref Range Status   Specimen Description   Final    BLOOD LEFT HAND Performed at [facility] [address] [place], [place] [address]    Special Requests   Final    BOTTLES DRAWN AEROBIC AND ANAEROBIC Blood Culture adequate volume Performed at [facility] [address], [place], [place] [address]    Culture   Final    NO  GROWTH 1 DAY Performed at [facility], [place] [address], [place], [place] [address]    Report Status PENDING  Incomplete        Radiology Studies: MR FOOT RIGHT W CONTRAST  Result Date: 11/07/2021 CLINICAL DATA:  Posterior foot and ankle pain, diabetic foot wound EXAM: MRI OF THE RIGHT FOOT WITH CONTRAST TECHNIQUE: Multiplanar, multisequence MR imaging of the right hindfoot was performed following the administration of intravenous contrast. CONTRAST:  23mL GADAVIST GADOBUTROL 1 MMOL/ML IV SOLN COMPARISON:  X-ray 11/07/2021 FINDINGS: Technical Note: Despite efforts by the technologist and patient, motion artifact is present on today's exam and could not be eliminated. This reduces exam sensitivity and specificity. Bones/Joint/Cartilage No evidence of acute fracture or malalignment. No focal area of bone destruction or erosion. No bone marrow edema or periostitis is evident. No significant arthropathy involving the ankle or midfoot. No joint effusions. Ligaments Ligamentous structures of the ankle and midfoot appear intact. Muscles and Tendons Mild distal Achilles tendinosis with mild peritendinitis. Flexor and extensor tendons of the ankle appear intact. No tenosynovitis. No intramuscular abnormality. Soft tissues Soft tissue edema with skin thickening at the posterior aspect of the lower leg and ankle. Associated soft tissue enhancement. No organized fluid collections. No deep soft tissue ulceration. No enhancing mass. IMPRESSION: 1. Motion degraded exam. 2. Soft tissue edema with skin thickening and enhancement at the posterior aspect of the lower leg and ankle most consistent with cellulitis. No organized fluid collections. 3. No evidence of acute osteomyelitis. 4. Mild distal Achilles tendinosis with peritendinitis. Electronically Signed   By: Davina Poke D.O.   On: 11/07/2021 12:53   DG Foot Complete Right  Result Date: 11/07/2021 CLINICAL DATA:  Swelling, redness.  Evaluate for  osteomyelitis. EXAM: RIGHT FOOT COMPLETE - 3+ VIEW COMPARISON:  None. FINDINGS: There is no evidence of fracture or dislocation. There is no evidence of arthropathy or other focal bone abnormality. No bone destruction. Soft tissues are unremarkable. IMPRESSION: Negative. Electronically Signed   By: Rolm Baptise M.D.   On: 11/07/2021 03:09   VAS [place] ABI WITH/WO TBI  Result Date: 11/07/2021  LOWER EXTREMITY DOPPLER STUDY Patient Name:  CHEEMENG GAWNE  Date of Exam:   11/07/2021 Medical Rec #: [id number]     Accession #:    [id number] Date of Birth: [birth year]      Patient Gender: M Patient Age:   21 years Exam Location:  [facility] Procedure:      VAS [place] ABI WITH/WO TBI Referring Phys: Anderson Malta YATES --------------------------------------------------------------------------------  Indications: Ulceration. High Risk Factors: Hypertension, Diabetes.  Comparison Study: No prior studies. Performing Technologist: Carlos Levering RVT  Examination Guidelines: A complete evaluation includes at minimum, Doppler waveform signals and systolic blood pressure reading at the level of bilateral brachial, anterior tibial, and posterior tibial arteries, when vessel segments are accessible. Bilateral testing is considered an integral part of a complete examination. Photoelectric Plethysmograph (PPG) waveforms and toe systolic pressure  readings are included as required and additional duplex testing as needed. Limited examinations for reoccurring indications may be performed as noted.  ABI Findings: +---------+------------------+-----+---------+--------+  Right     Rt Pressure (mmHg) Index Waveform  Comment   +---------+------------------+-----+---------+--------+  Brachial  153                      triphasic           +---------+------------------+-----+---------+--------+  PTA       118                0.77  biphasic            +---------+------------------+-----+---------+--------+  DP        113                0.74  biphasic             +---------+------------------+-----+---------+--------+  Great Toe 72                 0.47                      +---------+------------------+-----+---------+--------+ +---------+------------------+-----+---------+-------+  Left      Lt Pressure (mmHg) Index Waveform  Comment  +---------+------------------+-----+---------+-------+  Brachial  147                      triphasic          +---------+------------------+-----+---------+-------+  PTA       171                1.12  triphasic          +---------+------------------+-----+---------+-------+  DP        160                1.05  triphasic          +---------+------------------+-----+---------+-------+  Great Toe 94                 0.61                     +---------+------------------+-----+---------+-------+ +-------+-----------+-----------+------------+------------+  ABI/TBI Today's ABI Today's TBI Previous ABI Previous TBI  +-------+-----------+-----------+------------+------------+  Right   0.77        0.47                                   +-------+-----------+-----------+------------+------------+  Left    1.12        0.61                                   +-------+-----------+-----------+------------+------------+  Summary: Right: Resting right ankle-brachial index indicates moderate right lower extremity arterial disease. The right toe-brachial index is abnormal. Left: Resting left ankle-brachial index is within normal range. No evidence of significant left lower extremity arterial disease. The left toe-brachial index is abnormal.  *See table(s) above for measurements and observations.  Electronically signed by Servando Snare MD on 11/07/2021 at 3:12:01 PM.    Final    VAS [place] LOWER EXTREMITY VENOUS (DVT) (ONLY [facility] & [facility])  Result Date: 11/07/2021  Lower Venous DVT Study Patient Name:  KAYNEN HRUZA  Date of Exam:   11/07/2021 Medical Rec #: [id number]     Accession #:    [id number] Date of Birth:  1956/09/06      Patient Gender: M Patient Age:   7 years Exam  Location:  [facility] Procedure:      VAS [place] LOWER EXTREMITY VENOUS (DVT) Referring Phys: Regan Lemming --------------------------------------------------------------------------------  Indications: Swelling, and Erythema.  Risk Factors: None identified. Comparison Study: No prior studies. Performing Technologist: Oliver Hum RVT  Examination Guidelines: A complete evaluation includes B-mode imaging, spectral Doppler, color Doppler, and power Doppler as needed of all accessible portions of each vessel. Bilateral testing is considered an integral part of a complete examination. Limited examinations for reoccurring indications may be performed as noted. The reflux portion of the exam is performed with the patient in reverse Trendelenburg.  +---------+---------------+---------+-----------+----------+--------------+  RIGHT     Compressibility Phasicity Spontaneity Properties Thrombus Aging  +---------+---------------+---------+-----------+----------+--------------+  CFV       Full            Yes       Yes                                    +---------+---------------+---------+-----------+----------+--------------+  SFJ       Full                                                             +---------+---------------+---------+-----------+----------+--------------+  FV Prox   Full                                                             +---------+---------------+---------+-----------+----------+--------------+  FV Mid    Full                                                             +---------+---------------+---------+-----------+----------+--------------+  FV Distal Full                                                             +---------+---------------+---------+-----------+----------+--------------+  PFV       Full                                                             +---------+---------------+---------+-----------+----------+--------------+  POP       Full            Yes       Yes                                     +---------+---------------+---------+-----------+----------+--------------+  PTV       Full                                                             +---------+---------------+---------+-----------+----------+--------------+  PERO      Full                                                             +---------+---------------+---------+-----------+----------+--------------+   +----+---------------+---------+-----------+----------+--------------+  LEFT Compressibility Phasicity Spontaneity Properties Thrombus Aging  +----+---------------+---------+-----------+----------+--------------+  CFV  Full            Yes       Yes                                    +----+---------------+---------+-----------+----------+--------------+     Summary: RIGHT: - There is no evidence of deep vein thrombosis in the lower extremity.  - No cystic structure found in the popliteal fossa.  LEFT: - No evidence of common femoral vein obstruction.  *See table(s) above for measurements and observations. Electronically signed by Servando Snare MD on 11/07/2021 at 3:11:39 PM.    Final         Scheduled Meds:  docusate sodium  100 mg Oral BID   enoxaparin (LOVENOX) injection  40 mg Subcutaneous Daily   famotidine  20 mg Oral BID   feeding supplement (GLUCERNA SHAKE)  237 mL Oral BID BM   insulin aspart  0-15 Units Subcutaneous TID WC   insulin aspart  0-5 Units Subcutaneous QHS   multivitamin with minerals  1 tablet Oral Daily   Continuous Infusions:  cefTRIAXone (ROCEPHIN)  IV 2 g (11/08/21 [id number])   lactated ringers 75 mL/hr at 11/08/21 1229     LOS: 1 day     Cordelia Poche, MD [facility] 11/08/2021, 1:15 PM  If 7PM-7AM, please contact night-coverage [email or web address]

## 2021-11-08 NOTE — Progress Notes (Signed)
Initial Nutrition Assessment  DOCUMENTATION CODES:   Not applicable  INTERVENTION:  - will order Glucerna Shake BID, each supplement provides 220 kcal and 10 grams of protein. - will order 1 tablet multivitamin with minerals/day. - added "fish" and "shellfish" to list of allergies.   NUTRITION DIAGNOSIS:   Increased nutrient needs related to acute illness, wound healing as evidenced by estimated needs.  GOAL:   Patient will meet greater than or equal to 90% of their needs  MONITOR:   PO intake, Supplement acceptance, Labs, Weight trends  REASON FOR ASSESSMENT:   Consult Wound healing  ASSESSMENT:   66 y.o. male with medical history of DM, HTN, GERD, and HLD. He presented to the ED due to diabetic foot infection and mild cough.  Patient laying in bed and his wife was at bedside at the time of RD visit. Patient reports eating [place] sausage and scrambled eggs for breakfast this AM. He denies any abdominal pain, nausea, or cramping this AM or PTA.  He does report decreased appetite for several weeks and experiencing early satiety during that time frame.   He does not check CBGs at home.   He and wife report that he had an episode of eating fish at work which led to tongue swelling, shortness of breath, and need to be taken to the ED by EMS. He would like shellfish to also be entered as an allergy as he has been avoiding all seafood since that incident.   At home he enjoys protein foods with meals to include meats and peanut butter.   Wife requests eucerin lotion for patient; RD communicated with Pharmacist about this request.   Weight yesterday was documented as 160 lb and weight has been stable since 12/05/20.   Labs reviewed; HgbA1c: 6.1%, CBGs: 118 and 131 mg/dl, Ca: 8.6 mg/dl.  Medications reviewed; 100 mg colace BID, 20 mg oral pepcid BID, sliding scale novolog.  IVF; LR @ 75 ml/hr.     NUTRITION - FOCUSED PHYSICAL EXAM:  Flowsheet Row Most Recent Value   Orbital Region No depletion  Upper Arm Region No depletion  Thoracic and Lumbar Region Unable to assess  Buccal Region No depletion  Temple Region No depletion  Clavicle Bone Region No depletion  Clavicle and Acromion Bone Region No depletion  Scapular Bone Region No depletion  Dorsal Hand No depletion  Patellar Region No depletion  Anterior Thigh Region No depletion  Posterior Calf Region No depletion  Edema (RD Assessment) Mild  [BLE]  Hair Reviewed  Eyes Reviewed  Mouth Reviewed  Skin Reviewed  Nails Reviewed       Diet Order:   Diet Order             Diet Carb Modified Fluid consistency: Thin; Room service appropriate? Yes  Diet effective now                   EDUCATION NEEDS:   Not appropriate for education at this time  Skin:  Skin Assessment: Skin Integrity Issues: Skin Integrity Issues:: Diabetic Ulcer Diabetic Ulcer: R foot (1 cm x 1 cm)  Last BM:  PTA/unknown  Height:   Ht Readings from Last 1 Encounters:  11/07/21 5\' 7"  (1.702 m)    Weight:   Wt Readings from Last 1 Encounters:  11/07/21 72.6 kg     Estimated Nutritional Needs:  Kcal:  1900-2100 kcal Protein:  95-110 grams Fluid:  >/= 2 L/day     01/05/22, MS, RD, LDN  Inpatient Clinical Dietitian RD pager # available in [place]  After hours/weekend pager # available in [facility]

## 2021-11-09 ENCOUNTER — Encounter (HOSPITAL_COMMUNITY)
Admission: EM | Disposition: A | Discharge status: Home / Self Care | Payer: Self-pay | Source: Home / Self Care | Attending: Internal Medicine

## 2021-11-09 ENCOUNTER — Inpatient Hospital Stay (HOSPITAL_COMMUNITY): Payer: Medicare HMO

## 2021-11-09 DIAGNOSIS — D649 Anemia, unspecified: Secondary | ICD-10-CM | POA: Diagnosis present

## 2021-11-09 DIAGNOSIS — D539 Nutritional anemia, unspecified: Secondary | ICD-10-CM | POA: Diagnosis present

## 2021-11-09 DIAGNOSIS — R600 Localized edema: Secondary | ICD-10-CM | POA: Diagnosis present

## 2021-11-09 DIAGNOSIS — D72829 Elevated white blood cell count, unspecified: Secondary | ICD-10-CM | POA: Diagnosis present

## 2021-11-09 DIAGNOSIS — R7982 Elevated C-reactive protein (CRP): Secondary | ICD-10-CM | POA: Diagnosis present

## 2021-11-09 DIAGNOSIS — K5900 Constipation, unspecified: Secondary | ICD-10-CM | POA: Diagnosis present

## 2021-11-09 DIAGNOSIS — I70221 Atherosclerosis of native arteries of extremities with rest pain, right leg: Secondary | ICD-10-CM | POA: Diagnosis present

## 2021-11-09 DIAGNOSIS — E876 Hypokalemia: Secondary | ICD-10-CM | POA: Diagnosis present

## 2021-11-09 LAB — GLUCOSE, CAPILLARY
Glucose-Capillary: 104 mg/dL — ABNORMAL HIGH (ref 70–99)
Glucose-Capillary: 101 mg/dL — ABNORMAL HIGH (ref 70–99)

## 2021-11-09 LAB — CBC
HCT: 34.7 % — ABNORMAL LOW (ref 39.0–52.0)
Hemoglobin: 11.5 g/dL — ABNORMAL LOW (ref 13.0–17.0)
MCH: 32.7 pg (ref 26.0–34.0)
MCHC: 33.1 g/dL (ref 30.0–36.0)
MCV: 98.6 fL (ref 80.0–100.0)
Platelets: 167 10*3/uL (ref 150–400)
RBC: 3.52 MIL/uL — ABNORMAL LOW (ref 4.22–5.81)
RDW: 13.9 % (ref 11.5–15.5)
WBC: 13.1 10*3/uL — ABNORMAL HIGH (ref 4.0–10.5)
nRBC: 0 % (ref 0.0–0.2)

## 2021-11-09 LAB — BASIC METABOLIC PANEL
Anion gap: 7 (ref 5–15)
BUN: 13 mg/dL (ref 8–23)
CO2: 27 mmol/L (ref 22–32)
Calcium: 8.7 mg/dL — ABNORMAL LOW (ref 8.9–10.3)
Chloride: 100 mmol/L (ref 98–111)
Creatinine, Ser: 0.95 mg/dL (ref 0.61–1.24)
GFR, Estimated: >60 mL/min (ref >60)
Glucose, Bld: 141 mg/dL — ABNORMAL HIGH (ref 70–99)
Potassium: 3.3 mmol/L — ABNORMAL LOW (ref 3.5–5.1)
Sodium: 134 mmol/L — ABNORMAL LOW (ref 135–145)

## 2021-11-09 LAB — SEDIMENTATION RATE: Sed Rate: 94 mm/hr — ABNORMAL HIGH (ref 0–16)

## 2021-11-09 LAB — C-REACTIVE PROTEIN: CRP: 22.5 mg/dL — ABNORMAL HIGH (ref <1.0)

## 2021-11-09 SURGERY — LOWER EXTREMITY ANGIOGRAPHY
Anesthesia: LOCAL

## 2021-11-09 MED ORDER — POTASSIUM CHLORIDE 10 MEQ/100ML IV SOLN
10.0000 meq | INTRAVENOUS | Status: AC
  Administered 2021-11-09 (×3): 10 meq via INTRAVENOUS
  Filled 2021-11-09 (×2): qty 100

## 2021-11-09 MED ORDER — INSULIN ASPART 100 UNIT/ML IJ SOLN: 0.0000 [IU] | INTRAMUSCULAR | Status: DC

## 2021-11-09 MED ORDER — LACTULOSE 10 GM/15ML PO SOLN
20.0000 g | Freq: Two times a day (BID) | ORAL | Status: DC
  Administered 2021-11-09 – 2021-11-14 (×6): 20 g via ORAL
  Filled 2021-11-09 (×7): qty 30

## 2021-11-09 MED ORDER — POLYETHYLENE GLYCOL 3350 17 G PO PACK
17.0000 g | PACK | Freq: Every day | ORAL | Status: DC
  Administered 2021-11-10: 17 g via ORAL
  Filled 2021-11-09: qty 1

## 2021-11-09 MED ORDER — SODIUM CHLORIDE 0.9 % IV SOLN: INTRAVENOUS | Status: DC

## 2021-11-09 MED ORDER — POTASSIUM CHLORIDE 10 MEQ/100ML IV SOLN
INTRAVENOUS | Status: AC
  Filled 2021-11-09: qty 100

## 2021-11-09 MED ORDER — SENNOSIDES-DOCUSATE SODIUM 8.6-50 MG PO TABS
2.0000 | ORAL_TABLET | Freq: Two times a day (BID) | ORAL | Status: DC
  Administered 2021-11-09 – 2021-11-10 (×2): 2 via ORAL
  Filled 2021-11-09 (×3): qty 2

## 2021-11-09 MED ORDER — MORPHINE SULFATE (PF) 2 MG/ML IV SOLN: 2.0000 mg | INTRAVENOUS | Status: DC | PRN

## 2021-11-09 MED ORDER — METHOCARBAMOL 500 MG PO TABS
500.0000 mg | ORAL_TABLET | Freq: Three times a day (TID) | ORAL | Status: DC
  Administered 2021-11-09 – 2021-11-14 (×14): 500 mg via ORAL
  Filled 2021-11-09 (×14): qty 1

## 2021-11-09 MED ORDER — BISACODYL 10 MG RE SUPP: 10.0000 mg | Freq: Every day | RECTAL | Status: DC | PRN

## 2021-11-09 NOTE — Progress Notes (Signed)
Called to room by patient's wife. Wife irate stating she demands to know who authorized her husband's transport. RN attempts to conversate with patient's wife regarding his procedure and transport to [facility]. Wife loudly interrupts and does not allow RN to speak. States she will, "have a lawyer come over your head," if RN cannot "explain."  Wife continually asking questions and loudly verbalizing discontent without allowing RN to speak, then requests RN leave the room. Will update appropriate management.

## 2021-11-09 NOTE — Progress Notes (Signed)
[facility] arrived to transport patient to [facility] for procedure.

## 2021-11-09 NOTE — Progress Notes (Signed)
Per CV invasive lab at [facility], their RN will review patient chart and contact [facility] for transport at the appropriate time.

## 2021-11-09 NOTE — Progress Notes (Signed)
Call to Mrs. Gabreil Kilner regarding the return of her husband to [facility] admitted room. Mrs. Willinger was agitated and accused this Probation officer of inappropriately obtaining informed consent and performing procedures without her consent. Attempts to redirect the conversation were not successful. This Probation officer had to advise Mrs. [place] that Mr. Schorr was returning via [facility] to [facility] and that this Probation officer was going to terminate the phone call with Mrs. Carron Brazen.
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
## 2021-11-09 NOTE — Progress Notes (Addendum)
Arrived by [facility] from [place] to [facility]. Alert and oriented. Warm and dry; on room air. Patient wants doctor to talk with him before signing the consent.

## 2021-11-09 NOTE — Assessment & Plan Note (Signed)
Replaced IV °

## 2021-11-09 NOTE — Assessment & Plan Note (Addendum)
X-ray abdomen shows evidence of constipation without any obstruction. Continue current regimen.

## 2021-11-09 NOTE — Progress Notes (Signed)
Dr. Karin Lieu at patient's bedside explaining the procedure to be done; patient then signed the consent.

## 2021-11-09 NOTE — Progress Notes (Signed)
Patient returned from [facility] via [facility]. Alert and oriented x 4. Patient states he was ready for the procedure when his wife intervened. Patient is unclear as to why she was in opposition of the procedure, as it had been discussed and the patient had been preparing for it all morning.  Patient rates leg pain 10/10. Medicated appropriately per MAR.

## 2021-11-09 NOTE — Assessment & Plan Note (Addendum)
Hemoglobin was 13.7.  Currently trended down to 11.  Likely dilutional in nature.  Now stable. No active bleeding seen from the outside. Reticulocyte count normal. Iron level marginally low.  B12 292.

## 2021-11-09 NOTE — Assessment & Plan Note (Signed)
Doppler negative for DVT.

## 2021-11-09 NOTE — Hospital Course (Addendum)
66 year old male with past medical history of type II DM, HTN, HLD.  Presents with complaints of right leg pain and edema found to have diabetic foot infection with cellulitis of the right leg. 1/11 angiography canceled due to family issues. 1/13 underwent Right femoropopliteal angioplasty and stenting. Had episode of fever 1/14 developed fever blister.  1/16 remained fever free for 48 hours. Fever free for 24 hours on oral Antibiotics

## 2021-11-09 NOTE — Progress Notes (Signed)
Called to Holding room bay 6 regarding Warren Gray. [name] is alert and orientated- able to state date(day was misstated),year correctly identified, current President of the [place] correctly identified, and Correct current location verbalized. Pt. Is aware that wife had opposition to procedure today. Pt. Agreeable to discuss with wife and return in near future. MD Virl Cagey explained to patient after interaction with wife in [facility] area. Pt. Aware that he will return via [facility] transportation back to [place].

## 2021-11-09 NOTE — Assessment & Plan Note (Addendum)
Chronic limb threatening ischemia. SP right femoropopliteal angioplasty and stenting. Good technical results per vascular surgery. Follow-up with Dr. Antoine Poche in 1 month with ABI and right lower extremity Doppler. Currently on aspirin, plavix and Lipitor.
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
## 2021-11-10 DIAGNOSIS — Z0189 Encounter for other specified special examinations: Secondary | ICD-10-CM

## 2021-11-10 LAB — BASIC METABOLIC PANEL
Anion gap: 8 (ref 5–15)
BUN: 11 mg/dL (ref 8–23)
CO2: 23 mmol/L (ref 22–32)
Calcium: 8.4 mg/dL — ABNORMAL LOW (ref 8.9–10.3)
Chloride: 103 mmol/L (ref 98–111)
Creatinine, Ser: 0.88 mg/dL (ref 0.61–1.24)
GFR, Estimated: >60 mL/min (ref >60)
Glucose, Bld: 117 mg/dL — ABNORMAL HIGH (ref 70–99)
Potassium: 3.3 mmol/L — ABNORMAL LOW (ref 3.5–5.1)
Sodium: 134 mmol/L — ABNORMAL LOW (ref 135–145)

## 2021-11-10 LAB — C-REACTIVE PROTEIN: CRP: 20 mg/dL — ABNORMAL HIGH (ref <1.0)

## 2021-11-10 LAB — MAGNESIUM: Magnesium: 2.1 mg/dL (ref 1.7–2.4)

## 2021-11-10 LAB — CBC WITH DIFFERENTIAL/PLATELET
Abs Immature Granulocytes: 0.08 10*3/uL — ABNORMAL HIGH (ref 0.00–0.07)
Basophils Absolute: 0 10*3/uL (ref 0.0–0.1)
Basophils Relative: 0 %
Eosinophils Absolute: 0.1 10*3/uL (ref 0.0–0.5)
Eosinophils Relative: 1 %
HCT: 32.9 % — ABNORMAL LOW (ref 39.0–52.0)
Hemoglobin: 10.7 g/dL — ABNORMAL LOW (ref 13.0–17.0)
Immature Granulocytes: 1 %
Lymphocytes Relative: 19 %
Lymphs Abs: 2.1 10*3/uL (ref 0.7–4.0)
MCH: 32 pg (ref 26.0–34.0)
MCHC: 32.5 g/dL (ref 30.0–36.0)
MCV: 98.5 fL (ref 80.0–100.0)
Monocytes Absolute: 0.6 10*3/uL (ref 0.1–1.0)
Monocytes Relative: 6 %
Neutro Abs: 8.2 10*3/uL — ABNORMAL HIGH (ref 1.7–7.7)
Neutrophils Relative %: 73 %
Platelets: 209 10*3/uL (ref 150–400)
RBC: 3.34 MIL/uL — ABNORMAL LOW (ref 4.22–5.81)
RDW: 13.7 % (ref 11.5–15.5)
WBC: 11.1 10*3/uL — ABNORMAL HIGH (ref 4.0–10.5)
nRBC: 0 % (ref 0.0–0.2)

## 2021-11-10 LAB — GLUCOSE, CAPILLARY
Glucose-Capillary: 119 mg/dL — ABNORMAL HIGH (ref 70–99)
Glucose-Capillary: 98 mg/dL (ref 70–99)
Glucose-Capillary: 158 mg/dL — ABNORMAL HIGH (ref 70–99)
Glucose-Capillary: 134 mg/dL — ABNORMAL HIGH (ref 70–99)
Glucose-Capillary: 170 mg/dL — ABNORMAL HIGH (ref 70–99)

## 2021-11-10 MED ORDER — POTASSIUM CHLORIDE CRYS ER 20 MEQ PO TBCR
40.0000 meq | EXTENDED_RELEASE_TABLET | Freq: Once | ORAL | Status: AC
  Administered 2021-11-10: 40 meq via ORAL
  Filled 2021-11-10: qty 2

## 2021-11-10 NOTE — Care Management Important Message (Signed)
Important Message  Patient Details IM Letter placed in Patients room. Name: Warren Gray MRN: [id number] Date of Birth: [birth year]   Medicare Important Message Given:  Yes     Caren Macadam 11/10/2021, 12:59 PM

## 2021-11-10 NOTE — Progress Notes (Signed)
Progress Note   Patient: Warren Gray [id number] DOB: 1956-02-10 DOA: 11/06/2021     3 DOS: the patient was seen and examined on 11/10/2021   Brief hospital course: 66 year old male with past medical history of type II DM, HTN, HLD.  Presents with complaints of right leg pain and edema found to have diabetic foot infection with cellulitis of the right leg.  Work-up so far is negative for osteomyelitis but consistent with critical limb ischemia of the right leg. Vascular surgery consulted. 1/11 angiography canceled due to family issues.  Assessment and Plan * Diabetic foot infection (Warren Gray)- (present on admission) Right foot with cellulitis extending up lower leg. Started empirically on Ceftriaxone IV.  CRP of 17.6 on admission worsening. MRI obtained and was negative for acute osteomyelitis.  RLE venous duplex negative for acute DVT.  ABI significant for moderate arterial disease.  Blood cultures obtained on admission and are so far not growing any organism. PT evaluated with recommendation for no follow-up. Leukocytosis worsening as well. -Continue Ceftriaxone IV -Continue to follow blood culture data -Vascular surgery consulted, currently procedure postponed due to Patient and family disagreement.  I discussed with Dr. Unk Gray on 1/12.  They are looking for opening in the schedule for procedure with for discussing consent with the patient.  Critical limb ischemia of right lower extremity (Warren Gray)- (present on admission) Vascular surgery was consulted.  Saw the patient and wife on 1/10.  Recommendation was to proceed with angiography via left femoral approach and intervention of the right leg if indicated to be performed at [facility] for which the patient will require transfer. After obtaining informed consent from the patient, wife becomes irate and disruptive requiring cancellation of the procedure. I have categorically explained to the patient that he remains at risk for losing  his foot if vascular intervention is not done  Diabetes mellitus type 2, controlled (Warren Gray) Not on medication as an outpatient. Hemoglobin A1C of 6.1% this admission. -Carb modified diet Would not continue sliding scale given his CBGs are normal.  Constipation- (present on admission) Patient reported burping and somewhat nausea in the afternoon had some abdominal distention as well.  X-ray abdomen shows evidence of constipation without any obstruction. Will initiate aggressive regimen.  GERD (gastroesophageal reflux disease)- (present on admission) -Continue Pepcid  Hypokalemia- (present on admission) Replaced IV.  High cholesterol- (present on admission) Lipid panel obtained this admission with LDL of 90.  Normocytic anemia- (present on admission) Hemoglobin was 13.7.  Currently trended down to 11.5 likely dilutional in nature. No active bleeding seen from the outside. Will monitor. Transfer for hemoglobin less than 7 or hemodynamic instability. Will initiate lab work-up.  Hypertension- (present on admission) Not on medication as an outpatient. Started on PRN hydralazine inpatient -Continue hydralazine PRN  Tobacco dependence- (present on admission) Tobacco cessation discussed on admission. -Continue nicotine patch  Pedal edema- (present on admission) Doppler negative for DVT.  Encounter for assessment of decision-making capacity Appreciate psychiatry consultation.  On 1/12. Patient has mental capacity for decision-making and has capacity to participate in treatment.     Subjective: Pain remains controlled.  No nausea no vomiting no fever no chills.  Objective Vitals:   11/09/21 2310 11/10/21 0545 11/10/21 1311 11/10/21 2137  BP: 134/75 (!) 155/83 (!) 150/80 (!) 142/89  Pulse: 84 79 79 78  Resp: 18 17 18 16   Temp: (!) 100.5 F (38.1 C) 98.3 F (36.8 C) 98.7 F (37.1 C) 99.3 F (37.4 C)  TempSrc: Oral Oral  SpO2: 95% 98% 100% 96%  Weight:      Height:         General: Appear in mild distress, no Rash; Oral Mucosa Clear, moist. no Abnormal Neck Mass Or lumps, Conjunctiva normal  Cardiovascular: S1 and S2 Present, no Murmur, Respiratory: good respiratory effort, Bilateral Air entry present and CTA, no Crackles, no wheezes Abdomen: Bowel Sound present, Soft and no tenderness Extremities: Right pedal edema Neurology: alert and oriented to time, place, and person affect appropriate. no new focal deficit Gait not checked due to patient safety concerns    Data Reviewed:  Anemia worsening.  Leukocytosis improving.  Potassium level still low.  Family Communication: None at bedside.  Disposition: Status is: Inpatient  Remains inpatient appropriate because: Requiring IV antibiotics and will also require intervention.         Time spent: 55 minutes  Author: Berle Mull, MD 11/10/2021 9:48 PM  For on call review [email or web address].

## 2021-11-10 NOTE — Consult Note (Signed)
°  Warren Gray is a 66 y.o. male with a history of diabetes mellitus type 2, hypertension, hyperlipidemia. Patient presented secondary to a foot infection and found to have evidence of a diabetic foot infection with cellulitis. MRI negative for osteomyelitis. Ceftriaxone IV initiated. ABIs consistent with vascular disease of the right leg.  Patient has been scheduled for right lower extremity angiography in an effort to improve distal perfusion and heel his right foot wound.  During this hospitalization his significant other has been displaying erratic behavior, mood lability, verbally threatening, and has refused for patient to have procedure.  Psych consult was placed for capacity evaluation for medical decision-making.  On evaluation patient denies any previous psychiatric diagnoses, and or acute psychiatric symptoms that would alter his ability to think and or make appropriate decision making.  Patient is able to understand his ongoing medical problem" I have a blister on my right foot that has now become infected.  I have been admitted to receive antibiotics, to reduce the infection in my foot."  Patient also was able to understand proposed treatment that includes" right foot procedure.  I cannot pronounce the word but is supposed to help reduce the infection."  Patient is also asked what else can we do to help you he states" I am receiving IV antibiotics to help reduce the infection.  I hope to go for the procedure tomorrow, however I am on your time and I wanted to be done right so I will wait and be patient.  I hope the good got is on my side."  Patient is also able to understand alternative treatment as noted above patient is aware he can have intravenous antibiotics to reduce infection, and procedure.  Patient states" there is still a possibility that I still may lose my foot even if I get the procedure done.  But I am praying that this procedure works and I am able to keep my foot." Patient is able to  understand the option of refusing proposed treatment.  He is able to cite" I may lose my foot even if I had the procedure.  Also may lose my life if the infection gets worse and goes untreated."  Patient is also able to appreciate reasonably foreseeable consequences of assessment proposed treatment as noted above.  The patient's decision is not affected by any acute depression, psychosis, mania.  Patient did not present with any thoughts disorder, cognitive impairment, and or lacking insight or judgment to make appropriate decisions.  Patient denies any paranoia, delusions, suicidal ideation, homicidal ideation, and or auditory or visual hallucinations.   Conclusion: At this time, there is insufficient evidence to warrant removal of patient's right for medical decision-making.  He can clearly determine mental capacity for decision-making due to presence of right extremity cellulitis with vascular disease.   For these reasons writer feels that patient has capacity at this point in time to participate in treatment.  This has been communicated with consulting hospitalist Dr. Allena Katz.

## 2021-11-10 NOTE — TOC Initial Note (Signed)
Transition of Care [facility]) - Initial/Assessment Note   Patient Details  Name: Warren Gray MRN: [id number] Date of Birth: 02-24-1956  Transition of Care [facility]) CM/SW Contact:    Sherie Don, LCSW Phone Number: 11/10/2021, 2:41 PM  Clinical Narrative: [facility] consulted for safety issues. PT evaluation recommended a rolling walker, but no PT follow up. CSW met with patient to discuss his concerns. CSW asked about patient's current living arrangement. Per patient, he has not lived with his wife in years and has been living at several motels in the area. Patient reported he will not go to shelter in [place] because the shelters screen everyone. Patient has previously stayed at [facility] shelter in [place] as this facility "takes everyone."  Patient asked about DSS assisting him with housing/placement. CSW provided patient with number for [facility]. Patient reported he works at [employer] and transferred from [place] to [place] about 2 years ago. Patient reported he receives $734/month from social security and about $800 from his work checks. CSW asked patient where he was living right before he came to the hospital. Patient reported he was living in a motel near [employer]. CSW asked what patient does for housing when he runs out of money. Patient stated, "I come to live at the hospital." CSW asked about patient's housing plan at discharge. Patient stated, "I've decided to keep things the way it is" and reports he uses the bus for transportation. Patient plans to "go straight to the bus station" at discharge. Patient reported he can "make arrangements" to go back to [place]. Patient is agreeable to DME referral to [employer].  CSW made rolling walker referral to [employer]. CSW completed [employer] request and faxed the clinicals for prior authorization. [employer] delivered walker to patient's room.  Expected Discharge Plan: Home/Self Care Barriers to Discharge: Continued Medical Work  up  Patient Goals and CMS Choice Patient states their goals for this hospitalization and ongoing recovery are:: Return to [place], [place] [employer]gov Compare Post Acute Care list provided to:: Patient Choice offered to / list presented to : Patient  Expected Discharge Plan and Services Expected Discharge Plan: Home/Self Care In-house Referral: Clinical Social Work Post Acute Care Choice: Durable Medical Equipment Living arrangements for the past 2 months: Hotel/Motel          DME Arranged: Walker rolling DME Agency: [employer] Date DME Agency Contacted: 11/10/21 Representative spoke with at DME Agency: Andee Poles  Prior Living Arrangements/Services Living arrangements for the past 2 months: Hotel/Motel Lives with:: Self Patient language and need for interpreter reviewed:: Yes Need for Family Participation in Patient Care: No (Comment) Care giver support system in place?: Yes (comment) Criminal Activity/Legal Involvement Pertinent to Current Situation/Hospitalization: No - Comment as needed  Activities of Daily Living Home Assistive Devices/Equipment: None ADL Screening (condition at time of admission) Patient's cognitive ability adequate to safely complete daily activities?: Yes Is the patient deaf or have difficulty hearing?: No Does the patient have difficulty seeing, even when wearing glasses/contacts?: No Does the patient have difficulty concentrating, remembering, or making decisions?: No Patient able to express need for assistance with ADLs?: Yes Does the patient have difficulty dressing or bathing?: No Independently performs ADLs?: Yes (appropriate for developmental age) Does the patient have difficulty walking or climbing stairs?: Yes Weakness of Legs: Right Weakness of Arms/Hands: None  Permission Sought/Granted Permission sought to share information with : Other (comment) Permission granted to share information with : Yes, Verbal Permission Granted Permission  granted to  share info w AGENCY: [employer]  Emotional Assessment Appearance:: Appears stated age Attitude/Demeanor/Rapport: Reactive Affect (typically observed): Agitated Orientation: : Oriented to Self, Oriented to Place, Oriented to  Time, Oriented to Situation Alcohol / Substance Use: Tobacco Use Psych Involvement: No (comment)  Admission diagnosis:  Cellulitis of right lower extremity [L03.115] Diabetic foot infection ([place]) [W58.099, L08.9] Diabetic ulcer of right midfoot associated with diabetes mellitus due to underlying condition, limited to breakdown of skin ([place]) [I33.825, L97.411] Patient Active Problem List   Diagnosis Date Noted   Hypokalemia 11/09/2021   Normocytic anemia 11/09/2021   Leucocytosis 11/09/2021   Pedal edema 11/09/2021   CRP elevated 11/09/2021   Constipation 11/09/2021   Critical limb ischemia of right lower extremity ([place]) 11/09/2021   Diabetic foot infection ([place]) 11/07/2021   Diabetes mellitus type 2, controlled ([place]) 11/07/2021   GERD (gastroesophageal reflux disease) 11/07/2021   High cholesterol 11/07/2021   Hypertension 11/07/2021   Tobacco dependence 11/07/2021   PCP:  Loura Pardon, MD Pharmacy:   [id number] [place], [id number]: [phone]: [phone] [address]Phone: [phone]: [phone] Readmission Risk Interventions Readmission Risk Prevention Plan 11/10/2021  Transportation Screening Complete  HRI or HAntoineComplete  SW Recovery Care/Counseling Consult Complete  Palliative Care Screening Not ASt. MichaelNot Applicable  Some recent data might be hidden

## 2021-11-10 NOTE — Progress Notes (Signed)
Physical Therapy Treatment Patient Details Name: Warren Gray MRN: [id number] DOB: [birth year] Today's Date: 11/10/2021   History of Present Illness 66 yo male admitted with diabetic foot infection/cellulitis. Hx of DM.    PT Comments    Pt tolerated increased ambulation distance of 68' with RW, he maintained RLE in non weight bearing position 2* pain. Ambulation distance limited by pain.    Recommendations for follow up therapy are one component of a multi-disciplinary discharge planning process, led by the attending physician.  Recommendations may be updated based on patient status, additional functional criteria and insurance authorization.  Follow Up Recommendations  No PT follow up     Assistance Recommended at Discharge Intermittent Supervision/Assistance  Patient can return home with the following Help with stairs or ramp for entrance;Assist for transportation   Equipment Recommendations  Rolling walker (2 wheels)    Recommendations for Other Services       Precautions / Restrictions Precautions Precautions: Fall Restrictions Weight Bearing Restrictions: No     Mobility  Bed Mobility Overal bed mobility: Modified Independent             General bed mobility comments: used bedrail    Transfers Overall transfer level: Needs assistance Equipment used: Rolling walker (2 wheels) Transfers: Sit to/from Stand Sit to Stand: Supervision           General transfer comment: Cues for safety, technique, hand placement.    Ambulation/Gait Ambulation/Gait assistance: Min guard Gait Distance (Feet): 60 Feet Assistive device: Rolling walker (2 wheels) Gait Pattern/deviations: Step-to pattern;Antalgic Gait velocity: decr     General Gait Details: pt maintained RLE in NWB position 2* pain   Stairs             Wheelchair Mobility    Modified Rankin (Stroke Patients Only)       Balance Overall balance assessment: Needs assistance Sitting-balance  support: Feet supported Sitting balance-Leahy Scale: Good     Standing balance support: Bilateral upper extremity supported;Reliant on assistive device for balance Standing balance-Leahy Scale: Fair                              Cognition Arousal/Alertness: Awake/alert Behavior During Therapy: WFL for tasks assessed/performed Overall Cognitive Status: Within Functional Limits for tasks assessed                                          Exercises      General Comments        Pertinent Vitals/Pain Pain Score: 10-Worst pain ever Pain Location: R LE Pain Descriptors / Indicators: Discomfort;Sore;Grimacing;Guarding Pain Intervention(s): Limited activity within patient's tolerance;Monitored during session;Premedicated before session;Repositioned    Home Living                          Prior Function            PT Goals (current goals can now be found in the care plan section) Acute Rehab PT Goals Patient Stated Goal: less pain PT Goal Formulation: With patient Time For Goal Achievement: 11/22/21 Potential to Achieve Goals: Good Progress towards PT goals: Progressing toward goals    Frequency    Min 3X/week      PT Plan Current plan remains appropriate    Co-evaluation  AM-PAC PT "6 Clicks" Mobility   Outcome Measure  Help needed turning from your back to your side while in a flat bed without using bedrails?: None Help needed moving from lying on your back to sitting on the side of a flat bed without using bedrails?: None Help needed moving to and from a bed to a chair (including a wheelchair)?: A Little Help needed standing up from a chair using your arms (e.g., wheelchair or bedside chair)?: A Little Help needed to walk in hospital room?: A Little Help needed climbing 3-5 steps with a railing? : A Little 6 Click Score: 20    End of Session Equipment Utilized During Treatment: Gait belt Activity  Tolerance: Patient limited by pain Patient left: in bed;with call bell/phone within reach;with bed alarm set Nurse Communication: Mobility status PT Visit Diagnosis: Pain;Difficulty in walking, not elsewhere classified (R26.2) Pain - Right/Left: Right Pain - part of body: Ankle and joints of foot;Leg     Time: 1884-1660 PT Time Calculation (min) (ACUTE ONLY): 13 min  Charges:  $Gait Training: 8-22 mins                    Ralene Bathe Kistler PT 11/10/2021  Acute Rehabilitation Services Pager [phone] Office [phone]

## 2021-11-10 NOTE — Plan of Care (Signed)
  Problem: Pain Managment: Goal: General experience of comfort will improve Outcome: Progressing   Problem: Safety: Goal: Ability to remain free from injury will improve Outcome: Progressing   

## 2021-11-10 NOTE — Assessment & Plan Note (Signed)
Appreciate psychiatry consultation.  On 1/12. Patient has mental capacity for decision-making and has capacity to participate in treatment.

## 2021-11-11 ENCOUNTER — Inpatient Hospital Stay (HOSPITAL_COMMUNITY): Payer: Medicare HMO

## 2021-11-11 ENCOUNTER — Encounter (HOSPITAL_COMMUNITY)
Admission: EM | Disposition: A | Discharge status: Home / Self Care | Payer: Self-pay | Source: Home / Self Care | Attending: Internal Medicine

## 2021-11-11 DIAGNOSIS — E11621 Type 2 diabetes mellitus with foot ulcer: Secondary | ICD-10-CM

## 2021-11-11 DIAGNOSIS — R509 Fever, unspecified: Secondary | ICD-10-CM | POA: Diagnosis not present

## 2021-11-11 DIAGNOSIS — L97519 Non-pressure chronic ulcer of other part of right foot with unspecified severity: Secondary | ICD-10-CM

## 2021-11-11 DIAGNOSIS — E538 Deficiency of other specified B group vitamins: Secondary | ICD-10-CM | POA: Diagnosis present

## 2021-11-11 DIAGNOSIS — I70235 Atherosclerosis of native arteries of right leg with ulceration of other part of foot: Secondary | ICD-10-CM

## 2021-11-11 HISTORY — PX: PERIPHERAL VASCULAR INTERVENTION: CATH118257

## 2021-11-11 HISTORY — PX: LOWER EXTREMITY ANGIOGRAPHY: CATH118251

## 2021-11-11 LAB — IRON AND TIBC
Iron: 32 ug/dL — ABNORMAL LOW (ref 45–182)
Saturation Ratios: 15 % — ABNORMAL LOW (ref 17.9–39.5)
TIBC: 219 ug/dL — ABNORMAL LOW (ref 250–450)
UIBC: 187 ug/dL

## 2021-11-11 LAB — CBC
HCT: 33.2 % — ABNORMAL LOW (ref 39.0–52.0)
Hemoglobin: 10.7 g/dL — ABNORMAL LOW (ref 13.0–17.0)
MCH: 32.3 pg (ref 26.0–34.0)
MCHC: 32.2 g/dL (ref 30.0–36.0)
MCV: 100.3 fL — ABNORMAL HIGH (ref 80.0–100.0)
Platelets: 258 10*3/uL (ref 150–400)
RBC: 3.31 MIL/uL — ABNORMAL LOW (ref 4.22–5.81)
RDW: 13.5 % (ref 11.5–15.5)
WBC: 8 10*3/uL (ref 4.0–10.5)
nRBC: 0 % (ref 0.0–0.2)

## 2021-11-11 LAB — GLUCOSE, CAPILLARY
Glucose-Capillary: 121 mg/dL — ABNORMAL HIGH (ref 70–99)
Glucose-Capillary: 114 mg/dL — ABNORMAL HIGH (ref 70–99)
Glucose-Capillary: 151 mg/dL — ABNORMAL HIGH (ref 70–99)
Glucose-Capillary: 148 mg/dL — ABNORMAL HIGH (ref 70–99)
Glucose-Capillary: 166 mg/dL — ABNORMAL HIGH (ref 70–99)

## 2021-11-11 LAB — CBC WITH DIFFERENTIAL/PLATELET
Abs Immature Granulocytes: 0.06 10*3/uL (ref 0.00–0.07)
Basophils Absolute: 0 10*3/uL (ref 0.0–0.1)
Basophils Relative: 1 %
Eosinophils Absolute: 0.1 10*3/uL (ref 0.0–0.5)
Eosinophils Relative: 1 %
HCT: 33.5 % — ABNORMAL LOW (ref 39.0–52.0)
Hemoglobin: 11 g/dL — ABNORMAL LOW (ref 13.0–17.0)
Immature Granulocytes: 1 %
Lymphocytes Relative: 18 %
Lymphs Abs: 1.5 10*3/uL (ref 0.7–4.0)
MCH: 32.4 pg (ref 26.0–34.0)
MCHC: 32.8 g/dL (ref 30.0–36.0)
MCV: 98.8 fL (ref 80.0–100.0)
Monocytes Absolute: 0.5 10*3/uL (ref 0.1–1.0)
Monocytes Relative: 6 %
Neutro Abs: 5.9 10*3/uL (ref 1.7–7.7)
Neutrophils Relative %: 73 %
Platelets: 224 10*3/uL (ref 150–400)
RBC: 3.39 MIL/uL — ABNORMAL LOW (ref 4.22–5.81)
RDW: 13.5 % (ref 11.5–15.5)
WBC: 8 10*3/uL (ref 4.0–10.5)
nRBC: 0 % (ref 0.0–0.2)

## 2021-11-11 LAB — BASIC METABOLIC PANEL
Anion gap: 10 (ref 5–15)
BUN: 15 mg/dL (ref 8–23)
CO2: 23 mmol/L (ref 22–32)
Calcium: 8.7 mg/dL — ABNORMAL LOW (ref 8.9–10.3)
Chloride: 102 mmol/L (ref 98–111)
Creatinine, Ser: 0.58 mg/dL — ABNORMAL LOW (ref 0.61–1.24)
GFR, Estimated: >60 mL/min (ref >60)
Glucose, Bld: 124 mg/dL — ABNORMAL HIGH (ref 70–99)
Potassium: 3.5 mmol/L (ref 3.5–5.1)
Sodium: 135 mmol/L (ref 135–145)

## 2021-11-11 LAB — MAGNESIUM: Magnesium: 2.4 mg/dL (ref 1.7–2.4)

## 2021-11-11 LAB — RETICULOCYTES
Immature Retic Fract: 30.2 % — ABNORMAL HIGH (ref 2.3–15.9)
RBC.: 3.39 MIL/uL — ABNORMAL LOW (ref 4.22–5.81)
Retic Count, Absolute: 50.5 10*3/uL (ref 19.0–186.0)
Retic Ct Pct: 1.5 % (ref 0.4–3.1)

## 2021-11-11 LAB — VITAMIN B12: Vitamin B-12: 292 pg/mL (ref 180–914)

## 2021-11-11 LAB — PROCALCITONIN: Procalcitonin: <0.1 ng/mL

## 2021-11-11 LAB — LACTIC ACID, PLASMA: Lactic Acid, Venous: 1.8 mmol/L (ref 0.5–1.9)

## 2021-11-11 LAB — CK: Total CK: 24 U/L — ABNORMAL LOW (ref 49–397)

## 2021-11-11 LAB — FERRITIN: Ferritin: 842 ng/mL — ABNORMAL HIGH (ref 24–336)

## 2021-11-11 SURGERY — LOWER EXTREMITY ANGIOGRAPHY
Anesthesia: LOCAL

## 2021-11-11 MED ORDER — VANCOMYCIN HCL IN DEXTROSE 1-5 GM/200ML-% IV SOLN
1000.0000 mg | Freq: Two times a day (BID) | INTRAVENOUS | Status: DC
  Administered 2021-11-12 – 2021-11-13 (×3): 1000 mg via INTRAVENOUS
  Filled 2021-11-11 (×4): qty 200

## 2021-11-11 MED ORDER — CLOPIDOGREL BISULFATE 75 MG PO TABS: 75.0000 mg | ORAL_TABLET | Freq: Every day | ORAL | Status: DC

## 2021-11-11 MED ORDER — LIDOCAINE HCL (PF) 1 % IJ SOLN
INTRAMUSCULAR | Status: AC
  Filled 2021-11-11: qty 30

## 2021-11-11 MED ORDER — HEPARIN SODIUM (PORCINE) 1000 UNIT/ML IJ SOLN
INTRAMUSCULAR | Status: AC
  Filled 2021-11-11: qty 10

## 2021-11-11 MED ORDER — SODIUM CHLORIDE 0.9 % IV SOLN
250.0000 mL | INTRAVENOUS | Status: DC | PRN
  Administered 2021-11-11: 250 mL via INTRAVENOUS

## 2021-11-11 MED ORDER — ASPIRIN EC 81 MG PO TBEC
81.0000 mg | DELAYED_RELEASE_TABLET | Freq: Every day | ORAL | Status: DC
  Administered 2021-11-11 – 2021-11-14 (×4): 81 mg via ORAL
  Filled 2021-11-11 (×4): qty 1

## 2021-11-11 MED ORDER — LABETALOL HCL 5 MG/ML IV SOLN: 10.0000 mg | INTRAVENOUS | Status: DC | PRN

## 2021-11-11 MED ORDER — HEPARIN SODIUM (PORCINE) 5000 UNIT/ML IJ SOLN
5000.0000 [IU] | Freq: Three times a day (TID) | INTRAMUSCULAR | Status: DC
  Administered 2021-11-11 – 2021-11-14 (×8): 5000 [IU] via SUBCUTANEOUS
  Filled 2021-11-11 (×9): qty 1

## 2021-11-11 MED ORDER — SODIUM CHLORIDE 0.9% FLUSH: 3.0000 mL | INTRAVENOUS | Status: DC | PRN

## 2021-11-11 MED ORDER — CLOPIDOGREL BISULFATE 300 MG PO TABS
ORAL_TABLET | ORAL | Status: DC | PRN
  Administered 2021-11-11: 300 mg via ORAL

## 2021-11-11 MED ORDER — CLOPIDOGREL BISULFATE 75 MG PO TABS
75.0000 mg | ORAL_TABLET | Freq: Every day | ORAL | Status: DC
  Administered 2021-11-12 – 2021-11-14 (×3): 75 mg via ORAL
  Filled 2021-11-11 (×3): qty 1

## 2021-11-11 MED ORDER — HYDRALAZINE HCL 20 MG/ML IJ SOLN: 5.0000 mg | INTRAMUSCULAR | Status: DC | PRN

## 2021-11-11 MED ORDER — ATORVASTATIN CALCIUM 40 MG PO TABS
40.0000 mg | ORAL_TABLET | Freq: Every day | ORAL | Status: DC
  Administered 2021-11-11: 40 mg via ORAL
  Filled 2021-11-11: qty 1

## 2021-11-11 MED ORDER — HEPARIN (PORCINE) IN NACL 1000-0.9 UT/500ML-% IV SOLN
INTRAVENOUS | Status: AC
  Filled 2021-11-11: qty 1000

## 2021-11-11 MED ORDER — CLOPIDOGREL BISULFATE 300 MG PO TABS
ORAL_TABLET | ORAL | Status: AC
  Filled 2021-11-11: qty 1

## 2021-11-11 MED ORDER — FENTANYL CITRATE (PF) 100 MCG/2ML IJ SOLN
INTRAMUSCULAR | Status: DC | PRN
  Administered 2021-11-11: 50 ug via INTRAVENOUS

## 2021-11-11 MED ORDER — CLOPIDOGREL BISULFATE 75 MG PO TABS: 300.0000 mg | ORAL_TABLET | Freq: Once | ORAL | Status: DC

## 2021-11-11 MED ORDER — VITAMIN B-12 1000 MCG PO TABS
1000.0000 ug | ORAL_TABLET | Freq: Every day | ORAL | Status: DC
  Administered 2021-11-12 – 2021-11-14 (×3): 1000 ug via ORAL
  Filled 2021-11-11 (×3): qty 1

## 2021-11-11 MED ORDER — SODIUM CHLORIDE 0.9 % IV SOLN
2.0000 g | Freq: Three times a day (TID) | INTRAVENOUS | Status: DC
  Administered 2021-11-12 – 2021-11-13 (×5): 2 g via INTRAVENOUS
  Filled 2021-11-11 (×6): qty 2

## 2021-11-11 MED ORDER — CYANOCOBALAMIN 1000 MCG/ML IJ SOLN
1000.0000 ug | Freq: Once | INTRAMUSCULAR | Status: DC
Start: 2021-11-11 — End: 2021-11-14

## 2021-11-11 MED ORDER — SODIUM CHLORIDE 0.9 % WEIGHT BASED INFUSION
1.0000 mL/kg/h | INTRAVENOUS | Status: AC
  Administered 2021-11-11: 1 mL/kg/h via INTRAVENOUS

## 2021-11-11 MED ORDER — LIDOCAINE HCL (PF) 1 % IJ SOLN
INTRAMUSCULAR | Status: DC | PRN
  Administered 2021-11-11: 20 mL

## 2021-11-11 MED ORDER — ACETAMINOPHEN 325 MG PO TABS
650.0000 mg | ORAL_TABLET | ORAL | Status: DC | PRN
  Administered 2021-11-11 – 2021-11-12 (×2): 650 mg via ORAL

## 2021-11-11 MED ORDER — MIDAZOLAM HCL 2 MG/2ML IJ SOLN
INTRAMUSCULAR | Status: DC | PRN
  Administered 2021-11-11: 1 mg via INTRAVENOUS

## 2021-11-11 MED ORDER — MIDAZOLAM HCL 2 MG/2ML IJ SOLN
INTRAMUSCULAR | Status: AC
  Filled 2021-11-11: qty 2

## 2021-11-11 MED ORDER — ATORVASTATIN CALCIUM 40 MG PO TABS
80.0000 mg | ORAL_TABLET | Freq: Every day | ORAL | Status: DC
  Administered 2021-11-12 – 2021-11-14 (×3): 80 mg via ORAL
  Filled 2021-11-11 (×3): qty 2

## 2021-11-11 MED ORDER — VANCOMYCIN HCL IN DEXTROSE 1-5 GM/200ML-% IV SOLN
1000.0000 mg | Freq: Once | INTRAVENOUS | Status: AC
  Administered 2021-11-11: 1000 mg via INTRAVENOUS
  Filled 2021-11-11: qty 200

## 2021-11-11 MED ORDER — FENTANYL CITRATE (PF) 100 MCG/2ML IJ SOLN
INTRAMUSCULAR | Status: AC
  Filled 2021-11-11: qty 2

## 2021-11-11 MED ORDER — IODIXANOL 320 MG/ML IV SOLN
INTRAVENOUS | Status: DC | PRN
  Administered 2021-11-11: 130 mL

## 2021-11-11 MED ORDER — ONDANSETRON HCL 4 MG/2ML IJ SOLN: 4.0000 mg | Freq: Four times a day (QID) | INTRAMUSCULAR | Status: DC | PRN

## 2021-11-11 MED ORDER — SODIUM CHLORIDE 0.9 % IV SOLN: INTRAVENOUS | Status: DC

## 2021-11-11 MED ORDER — HEPARIN SODIUM (PORCINE) 1000 UNIT/ML IJ SOLN
INTRAMUSCULAR | Status: DC | PRN
  Administered 2021-11-11: 7000 [IU] via INTRAVENOUS

## 2021-11-11 MED ORDER — HEPARIN (PORCINE) IN NACL 1000-0.9 UT/500ML-% IV SOLN
INTRAVENOUS | Status: DC | PRN
  Administered 2021-11-11 (×2): 500 mL

## 2021-11-11 MED ORDER — SODIUM CHLORIDE 0.9 % IV SOLN
2.0000 g | Freq: Once | INTRAVENOUS | Status: AC
  Administered 2021-11-11: 2 g via INTRAVENOUS
  Filled 2021-11-11: qty 2

## 2021-11-11 MED ORDER — SODIUM CHLORIDE 0.9% FLUSH
3.0000 mL | Freq: Two times a day (BID) | INTRAVENOUS | Status: DC
  Administered 2021-11-12 – 2021-11-13 (×3): 3 mL via INTRAVENOUS

## 2021-11-11 SURGICAL SUPPLY — 18 items
BALLN MUSTANG 5X150X135 (BALLOONS) ×3
BALLOON MUSTANG 5X150X135 (BALLOONS) IMPLANT
CATH ANGIO 5F PIGTAIL 65CM (CATHETERS) ×1 IMPLANT
CATH QUICKCROSS .035X135CM (MICROCATHETER) ×1 IMPLANT
CATH TEMPO 5F RIM 65CM (CATHETERS) ×1 IMPLANT
CLOSURE PERCLOSE PROSTYLE (VASCULAR PRODUCTS) ×1 IMPLANT
GLIDEWIRE ADV .035X260CM (WIRE) ×1 IMPLANT
KIT ENCORE 26 ADVANTAGE (KITS) ×1 IMPLANT
KIT MICROPUNCTURE NIT STIFF (SHEATH) ×1 IMPLANT
KIT PV (KITS) ×3 IMPLANT
SHEATH PINNACLE 5F 10CM (SHEATH) ×1 IMPLANT
SHEATH PINNACLE ST 6F 45CM (SHEATH) ×1 IMPLANT
SHEATH PROBE COVER 6X72 (BAG) ×1 IMPLANT
STENT ELUVIA 6X150X130 (Permanent Stent) ×1 IMPLANT
SYR MEDRAD MARK V 150ML (SYRINGE) ×1 IMPLANT
TRANSDUCER W/STOPCOCK (MISCELLANEOUS) ×3 IMPLANT
TRAY PV CATH (CUSTOM PROCEDURE TRAY) ×3 IMPLANT
WIRE BENTSON .035X145CM (WIRE) ×1 IMPLANT

## 2021-11-11 NOTE — Assessment & Plan Note (Addendum)
Most likely postprocedure atelectasis induced fever. Started after angioplasty of 1/13. Chest x-ray shows possible left lower lobe infiltrate. Patient has some shortness of breath. antibiotic regimen was broadened to include vancomycin and cefepime. Blood cultures performed as well. Negative for 3 days.  UA normal procalcitonin normal Doppler negative again Now on oral Antibiotics

## 2021-11-11 NOTE — Progress Notes (Signed)
[facility] arrived to transport Warren Gray back to [facility]. Report given to CL and [facility] nurse Angelita Ingles is updated. Pt leaves cath lab in stable condition. Lt groin is unremarkable. Lt groin site dressing is CDI.

## 2021-11-11 NOTE — Progress Notes (Signed)
Patient stated he spoke to his wife and is o.k. to go for the procedure today.

## 2021-11-11 NOTE — Op Note (Addendum)
DATE OF SERVICE: 11/11/2021  PATIENT:  Warren Gray  66 y.o. male  PRE-OPERATIVE DIAGNOSIS:  Atherosclerosis of native arteries of right lower extremity causing ulceration  POST-OPERATIVE DIAGNOSIS:  Same  PROCEDURE:   1) US guided left common femoral artery access 2) Aortogram 3) Right lower extremity angiogram with third order cannulation (154mL total contrast) 4) Right femoropopliteal angioplasty and stenting (6x183mm Eluvia) 5) Conscious sedation (39 minutes)  SURGEON:  Yevonne Aline. Stanford Breed, MD  ASSISTANT: none  ANESTHESIA:   local and IV sedation  ESTIMATED BLOOD LOSS: minimal  LOCAL MEDICATIONS USED:  LIDOCAINE   COUNTS: confirmed correct.  PATIENT DISPOSITION:  PACU - hemodynamically stable.   Delay start of Pharmacological VTE agent (>24hrs) due to surgical blood loss or risk of bleeding: no  INDICATION FOR PROCEDURE: [name] is a 66 y.o. male with right foot ulceration and infection in setting of diabetes. After careful discussion of risks, benefits, and alternatives the patient was offered angiography with possible intervention The patient understood and wished to proceed.  OPERATIVE FINDINGS:  Terminal aorta and iliac arteries: Widely patent without flow limiting stenosis  Right lower extremity: Common femoral artery: Widely patent without flow limiting stenosis  Profunda femoris artery: Widely patent without flow limiting stenosis  Superficial femoral artery: Distal aspect with diffuse disease Popliteal artery: above knee segment diffusely diseased; greatest stenosis 80%. Behind and below knee segment without flow limiting stenosis Anterior tibial artery: Widely patent without flow limiting stenosis Tibioperoneal trunk: Widely patent without flow limiting stenosis Peroneal artery: Widely patent without flow limiting stenosis Posterior tibial artery: Widely patent without flow limiting stenosis Pedal circulation: not well visualized  Left lower  extremity: Common femoral artery: Widely patent without flow limiting stenosis  Profunda femoris artery: Widely patent without flow limiting stenosis  Superficial femoral artery: Mild diffuse disease Popliteal artery: Mild diffuse disease; greatest stenosis ~40% above the knee.  Anterior tibial artery: Not well visualized, but appears widely patent  Tibioperoneal trunk: Not well visualized, but appears widely patent  Peroneal artery: Not well visualized, but appears widely patent  Posterior tibial artery: Not well visualized, but appears widely patent s Pedal circulation: not evaluated     GLASS score II-III WIFI clinical stage I  DESCRIPTION OF PROCEDURE: After identification of the patient in the pre-operative holding area, the patient was transferred to the operating room. The patient was positioned supine on the operating room table. Anesthesia was induced. The groins was prepped and draped in standard fashion. A surgical pause was performed confirming correct patient, procedure, and operative location.  The left groin was anesthetized with subcutaneous injection of 1% lidocaine. Using ultrasound guidance, the left common femoral artery was accessed with micropuncture technique. Fluoroscopy was used to confirm cannulation over the femoral head. The 71F sheath was upsized to 72F.   A Benson wire was advanced into the distal aorta. Over the wire an omni flush catheter was advanced to the level of L2. Aortogram was performed - see above for details. Bilateral lower extremity runoff angiography was performed - see above for details.  The right common iliac artery was selected with a glidewire advantage. The wire was advanced into the common femoral artery. Over the wire the omni flush catheter was advanced into the external iliac artery. Selective angiography was performed - see above for details.   The decision was made to intervene The patient was heparinized with 7,000 units of heparin. The  72F sheath was exchanged for a 55F x 45cm sheath. Selective angiography of  the left lower extremity was performed prior to intervention.   The lesions were treated with: Angioplasty and stenting (6x164mm Eluvia post dilated with 5x120mm mustang)  Completion angiography revealed:  Resolution of femoropopliteal stenosis.  A perclose device was used to close the arteriotomy. Hemostasis was excellent upon completion.  Conscious sedation was administered with the use of IV fentanyl and midazolam under continuous physician and nurse monitoring.  Heart rate, blood pressure, and oxygen saturation were continuously monitored.  Total sedation time was 39 minutes  Upon completion of the case instrument and sharps counts were confirmed correct. The patient was transferred to the PACU in good condition. I was present for all portions of the procedure.  PLAN: ASA 81mg  PO QD indefinitely. Plavix 300mg  PO QD now. Plavix 75mg  PO QD x 12 months post procedure (stop date 11/11/22). High intensity statin therapy indefinitely. Ok to transfer back to [employer] after bedrest.  Yevonne Aline. Stanford Breed, MD [facility] Phone Number: [phone] 11/11/2021 9:48 AM

## 2021-11-11 NOTE — Progress Notes (Signed)
The patient's wife, Keylor Rands [phone]) called and stated that no procedure was to be done without her going over the paperwork. The patient stated that "no one will block his care this time" and the patient is alert and oriented. Previously, the patient's wife [facility] and argued with staff and her husband and the aortogram was canceled. Patient's wife appears unstable and previously security has been called due to her behavior. Pt's wife stated she will be out of town until [employer].

## 2021-11-11 NOTE — Progress Notes (Signed)
Received a call from Patient's PCP. Patient's PCP wants to talk to the provider taking care of. Informed Dr.patel.

## 2021-11-11 NOTE — Progress Notes (Signed)
PHARMACIST LIPID MONITORING   Warren Gray is a 66 y.o. male admitted on 11/06/2021.    Pharmacy has been consulted to optimize lipid-lowering therapy with the indication of secondary prevention for clinical ASCVD.  Recent Labs:  Lipid Panel (last 6 months):   Lab Results  Component Value Date   CHOL 135 11/08/2021   TRIG 91 11/08/2021   HDL 27 (L) 11/08/2021   CHOLHDL 5.0 11/08/2021   VLDL 18 11/08/2021   LDLCALC 90 11/08/2021    Hepatic function panel (last 6 months):   Lab Results  Component Value Date   AST 31 11/06/2021   ALT 34 11/06/2021   ALKPHOS 92 11/06/2021   BILITOT 0.9 11/06/2021    SCr (since admission):   Serum creatinine: 0.58 mg/dL (L) 02/72/53 6644 Estimated creatinine clearance: 84.9 mL/min (A)  Current therapy and lipid therapy tolerance Current lipid-lowering therapy: atorvastatin 40mg  Previous lipid-lowering therapies (if applicable):  Documented or reported allergies or intolerances to lipid-lowering therapies (if applicable): None  Plan:    1.Statin intensity (high intensity recommended for all patients regardless of the LDL):  Add or increase statin to high intensity.  2.Add ezetimibe (if any one of the following):   Not indicated at this time.  3.Refer to lipid clinic:   No  4.Follow-up with:  Cardiology provider - None  5.Follow-up labs after discharge:  Changes in lipid therapy were made. Check a lipid panel in 8-12 weeks then annually.     10-12 PharmD, BCPS Clinical Pharmacist [facility] main pharmacy [phone] 11/11/2021 2:23 PM

## 2021-11-11 NOTE — Assessment & Plan Note (Addendum)
Relatively low.  Continue replacement.

## 2021-11-11 NOTE — Progress Notes (Signed)
Pharmacy Antibiotic Note  Warren Gray is a 66 y.o. male admitted on 11/06/2021 with cellulitis.  Pharmacy has been consulted for vanc/cefepime dosing.  Plan: Vanc 1g IV q12 - goal AUC 400-550 Cefepime 2g IV q8  Height: 5\' 7"  (170.2 cm) Weight: 72.6 kg (160 lb) IBW/kg (Calculated) : 66.1  Temp (24hrs), Avg:99.5 F (37.5 C), Min:98 F (36.7 C), Max:101.3 F (38.5 C)  Recent Labs  Lab 11/06/21 0140 11/06/21 2323 11/08/21 0318 11/09/21 0316 11/10/21 0328 11/11/21 0319  WBC  --  10.1 11.5* 13.1* 11.1* 8.0  CREATININE  --  0.97 0.88 0.95 0.88 0.58*  LATICACIDVEN 1.4 1.8  --   --   --   --     Estimated Creatinine Clearance: 84.9 mL/min (A) (by C-G formula based on SCr of 0.58 mg/dL (L)).    Allergies  Allergen Reactions   Fish Allergy Swelling   Shellfish Allergy Swelling   Naproxen Itching   Zoloft [Sertraline Hcl] Diarrhea      Thank you for allowing pharmacy to be a part of this patients care.  Kara Mead 11/11/2021 5:27 PM

## 2021-11-11 NOTE — Progress Notes (Signed)
Received patient from  [facility] via [facility] alert and oriented X4 skin warm and dry, resp even and unlabored.  IVF infusing left arm, consent signed and patient on monitor.  Pt complain of pain in the right foot.  Dr Myra Gianotti in to see patient.

## 2021-11-11 NOTE — Progress Notes (Signed)
Progress Note   Patient: [name] [id number] DOB: 1956/03/20 DOA: 11/06/2021     4 DOS: the patient was seen and examined on 11/11/2021   Brief hospital course: 66 year old male with past medical history of type II DM, HTN, HLD.  Presents with complaints of right leg pain and edema found to have diabetic foot infection with cellulitis of the right leg.  Work-up so far is negative for osteomyelitis but consistent with critical limb ischemia of the right leg. Vascular surgery consulted. 1/11 angiography canceled due to family issues.  Assessment and Plan * Diabetic foot infection ([place])- (present on admission) Right foot with cellulitis extending up lower leg. Started empirically on Ceftriaxone IV.  CRP of 17.6 on admission worsening. MRI obtained and was negative for acute osteomyelitis.  RLE venous duplex negative for acute DVT.  ABI significant for moderate arterial disease.  Blood cultures obtained on admission and are so far not growing any organism. PT evaluated with recommendation for no follow-up. Leukocytosis worsening as well. -Continue Ceftriaxone IV -Continue to follow blood culture data -Vascular surgery consulted, initially procedure postponed due to Patient and family disagreement. Underwent angiography with right fempop stenting and angioplasty on 1/13.  Critical limb ischemia of right lower extremity ([place])- (present on admission) Vascular surgery was consulted. SP stenting and angioplasty of the right femoral-popliteal artery. Currently on aspirin and Plavix.  Diabetes mellitus type 2, controlled ([place]) Not on medication as an outpatient. Hemoglobin A1C of 6.1% this admission. -Carb modified diet Would not continue sliding scale given his CBGs are normal.  Fever Etiology not clear. Started after angioplasty of 1/13. Chest x-ray shows possible left lower lobe infiltrate. Patient has some shortness of breath. Currently antibiotic regimen was broadened to  include vancomycin and cefepime. Blood cultures performed as well. Check UA as well. Check procalcitonin. Monitor.  Constipation- (present on admission) Patient reported burping and somewhat nausea in the afternoon had some abdominal distention as well.  X-ray abdomen shows evidence of constipation without any obstruction. Will initiate aggressive regimen.  GERD (gastroesophageal reflux disease)- (present on admission) -Continue Pepcid  Hypokalemia- (present on admission) Replaced IV.  High cholesterol- (present on admission) Lipid panel obtained this admission with LDL of 90.  Normocytic anemia- (present on admission) Hemoglobin was 13.7.  Currently trended down to 11.5 likely dilutional in nature. No active bleeding seen from the outside. Will monitor. Transfer for hemoglobin less than 7 or hemodynamic instability. Will initiate lab work-up.  Hypertension- (present on admission) Not on medication as an outpatient. Started on PRN hydralazine inpatient -Continue hydralazine PRN  Tobacco dependence- (present on admission) Tobacco cessation discussed on admission. -Continue nicotine patch  Pedal edema- (present on admission) Doppler negative for DVT.  Encounter for assessment of decision-making capacity Appreciate psychiatry consultation.  On 1/12. Patient has mental capacity for decision-making and has capacity to participate in treatment.  B12 deficiency- (present on admission) Relatively low. Will replace.      Subjective: No nausea no vomiting no fever no chills.  No chest pain abdominal pain.  No fever in the afternoon.  Denies any shortness of breath but appears visibly short of breath.  No cough.  Does not want to take stool softener as his constipation is resolved.  Reports 6 bowel movements yesterday.  No blood in the stool.  Objective Vitals:   11/11/21 1235 11/11/21 1600 11/11/21 1716 11/11/21 1839  BP: (!) 132/57 133/66    Pulse: 69 73    Resp: 16 20     Temp:  98.9 F (37.2 C) (!) 101.3 F (38.5 C) 100.2 F (37.9 C) 99.2 F (37.3 C)  TempSrc: Oral Oral Oral Oral  SpO2: 98% 100%    Weight:      Height:        General: Appear in mild distress, no Rash; Oral Mucosa Clear, moist. no Abnormal Neck Mass Or lumps, Conjunctiva normal  Cardiovascular: S1 and S2 Present, no Murmur, Respiratory: increased respiratory effort, Bilateral Air entry present and faint   Crackles, no wheezes Abdomen: Bowel Sound present, Soft and no tenderness Extremities: trace Pedal edema Neurology: alert and oriented to time, place, and person affect appropriate. no new focal deficit Gait not checked due to patient safety concerns    Data Reviewed:  Stable WBC stable hemoglobin unremarkable procalcitonin.  Family Communication: None at bedside.  Disposition: Status is: Inpatient  Remains inpatient appropriate because: Ongoing fever requiring IV antibiotics and further work-up.     Time spent: 50 minutes  Author: Berle Mull, MD 11/11/2021 8:08 PM  For on call review [email or web address].

## 2021-11-11 NOTE — Interval H&P Note (Signed)
History and Physical Interval Note:  11/11/2021 8:44 AM  Warren Gray  has presented today for surgery, with the diagnosis of leg pain.  The various methods of treatment have been discussed with the patient and family. After consideration of risks, benefits and other options for treatment, the patient has consented to  Procedure(s): LOWER EXTREMITY ANGIOGRAPHY (N/A) as a surgical intervention.  The patient's history has been reviewed, patient examined, no change in status, stable for surgery.  I have reviewed the patient's chart and labs.  Questions were answered to the patient's satisfaction.     [name]

## 2021-11-12 ENCOUNTER — Inpatient Hospital Stay (HOSPITAL_COMMUNITY): Payer: Medicare HMO

## 2021-11-12 DIAGNOSIS — B001 Herpesviral vesicular dermatitis: Secondary | ICD-10-CM | POA: Diagnosis not present

## 2021-11-12 DIAGNOSIS — R509 Fever, unspecified: Secondary | ICD-10-CM

## 2021-11-12 LAB — CBC WITH DIFFERENTIAL/PLATELET
Abs Immature Granulocytes: 0.05 10*3/uL (ref 0.00–0.07)
Basophils Absolute: 0 10*3/uL (ref 0.0–0.1)
Basophils Relative: 0 %
Eosinophils Absolute: 0.2 10*3/uL (ref 0.0–0.5)
Eosinophils Relative: 2 %
HCT: 33.8 % — ABNORMAL LOW (ref 39.0–52.0)
Hemoglobin: 10.9 g/dL — ABNORMAL LOW (ref 13.0–17.0)
Immature Granulocytes: 1 %
Lymphocytes Relative: 20 %
Lymphs Abs: 1.7 10*3/uL (ref 0.7–4.0)
MCH: 31.9 pg (ref 26.0–34.0)
MCHC: 32.2 g/dL (ref 30.0–36.0)
MCV: 98.8 fL (ref 80.0–100.0)
Monocytes Absolute: 0.5 10*3/uL (ref 0.1–1.0)
Monocytes Relative: 6 %
Neutro Abs: 5.9 10*3/uL (ref 1.7–7.7)
Neutrophils Relative %: 71 %
Platelets: 286 10*3/uL (ref 150–400)
RBC: 3.42 MIL/uL — ABNORMAL LOW (ref 4.22–5.81)
RDW: 13.4 % (ref 11.5–15.5)
WBC: 8.3 10*3/uL (ref 4.0–10.5)
nRBC: 0 % (ref 0.0–0.2)

## 2021-11-12 LAB — BASIC METABOLIC PANEL
Anion gap: 7 (ref 5–15)
BUN: 12 mg/dL (ref 8–23)
CO2: 23 mmol/L (ref 22–32)
Calcium: 8.3 mg/dL — ABNORMAL LOW (ref 8.9–10.3)
Chloride: 105 mmol/L (ref 98–111)
Creatinine, Ser: 0.75 mg/dL (ref 0.61–1.24)
GFR, Estimated: >60 mL/min (ref >60)
Glucose, Bld: 111 mg/dL — ABNORMAL HIGH (ref 70–99)
Potassium: 3.8 mmol/L (ref 3.5–5.1)
Sodium: 135 mmol/L (ref 135–145)

## 2021-11-12 LAB — LIPID PANEL
Cholesterol: 103 mg/dL (ref 0–200)
HDL: 18 mg/dL — ABNORMAL LOW (ref >40)
LDL Cholesterol: 67 mg/dL (ref 0–99)
Total CHOL/HDL Ratio: 5.7 RATIO
Triglycerides: 92 mg/dL (ref <150)
VLDL: 18 mg/dL (ref 0–40)

## 2021-11-12 LAB — CULTURE, BLOOD (ROUTINE X 2)
Culture: NO GROWTH
Special Requests: ADEQUATE

## 2021-11-12 LAB — GLUCOSE, CAPILLARY
Glucose-Capillary: 131 mg/dL — ABNORMAL HIGH (ref 70–99)
Glucose-Capillary: 130 mg/dL — ABNORMAL HIGH (ref 70–99)
Glucose-Capillary: 124 mg/dL — ABNORMAL HIGH (ref 70–99)
Glucose-Capillary: 141 mg/dL — ABNORMAL HIGH (ref 70–99)

## 2021-11-12 LAB — PROCALCITONIN: Procalcitonin: <0.1 ng/mL

## 2021-11-12 LAB — MAGNESIUM: Magnesium: 2.2 mg/dL (ref 1.7–2.4)

## 2021-11-12 MED ORDER — SIMETHICONE 80 MG PO CHEW
80.0000 mg | CHEWABLE_TABLET | Freq: Four times a day (QID) | ORAL | Status: DC
  Administered 2021-11-12 – 2021-11-14 (×6): 80 mg via ORAL
  Filled 2021-11-12 (×7): qty 1

## 2021-11-12 MED ORDER — MORPHINE SULFATE (PF) 4 MG/ML IV SOLN: 4.0000 mg | INTRAVENOUS | Status: DC | PRN

## 2021-11-12 MED ORDER — OXYCODONE HCL 5 MG PO TABS
5.0000 mg | ORAL_TABLET | ORAL | Status: DC | PRN
  Administered 2021-11-12 – 2021-11-14 (×9): 10 mg via ORAL
  Filled 2021-11-12 (×9): qty 2

## 2021-11-12 MED ORDER — BENZOCAINE 10 % MT GEL
Freq: Four times a day (QID) | OROMUCOSAL | Status: DC | PRN
  Filled 2021-11-12 (×2): qty 9.4

## 2021-11-12 MED ORDER — VALACYCLOVIR HCL 500 MG PO TABS
2000.0000 mg | ORAL_TABLET | Freq: Two times a day (BID) | ORAL | Status: AC
  Administered 2021-11-12 (×2): 2000 mg via ORAL
  Filled 2021-11-12 (×2): qty 4

## 2021-11-12 MED ORDER — POLYETHYLENE GLYCOL 3350 17 G PO PACK: 17.0000 g | PACK | Freq: Every day | ORAL | Status: DC | PRN

## 2021-11-12 NOTE — Progress Notes (Signed)
Lower extremity venous RT study completed.  Preliminary results relayed to Allena Katz, MD.  See CV Proc for preliminary results report.   Jean Rosenthal, RDMS, RVT

## 2021-11-12 NOTE — Progress Notes (Addendum)
Progress Note   Patient: Warren Gray FOY:[id number] DOB: 11/19/1955 DOA: 11/06/2021     5 DOS: the patient was seen and examined on 11/12/2021   Brief hospital course: 66 year old male with past medical history of type II DM, HTN, HLD.  Presents with complaints of right leg pain and edema found to have diabetic foot infection with cellulitis of the right leg.  Work-up so far is negative for osteomyelitis but consistent with critical limb ischemia of the right leg. Vascular surgery consulted. 1/11 angiography canceled due to family issues.  Assessment and Plan * Diabetic foot infection ([facility])- (present on admission) Right foot with cellulitis extending up lower leg. Started empirically on Ceftriaxone IV.  CRP of 17.6 on admission worsening. MRI obtained and was negative for acute osteomyelitis.  RLE venous duplex negative for acute DVT.  ABI significant for moderate arterial disease.  Blood cultures obtained on admission and are so far not growing any organism. PT evaluated with recommendation for no follow-up. Leukocytosis worsening as well. - was on ceftriaxone now on cefepime and vancomycin. -Continue to follow blood culture data, no growth so far, repeat cultures on 1/13 due to fever -Vascular surgery consulted, initially procedure postponed due to Patient and family disagreement. Underwent angiography with right fempop stenting and angioplasty on 1/13.  Critical limb ischemia of right lower extremity ([facility])- (present on admission) Vascular surgery was consulted. SP stenting and angioplasty of the right femoral-popliteal artery. Currently on aspirin and Plavix.  Diabetes mellitus type 2, controlled ([facility]) Not on medication as an outpatient. Hemoglobin A1C of 6.1% this admission. Sugars controlled -Carb modified diet Would not continue sliding scale given his CBGs are normal.  Fever Etiology not clear. Started after angioplasty of 1/13. Chest x-ray shows possible left lower lobe  infiltrate. Patient has some shortness of breath. Currently antibiotic regimen was broadened to include vancomycin and cefepime. Blood cultures performed as well. UA normal procalcitonin normal Doppler negative again  Constipation- (present on admission) Patient reported burping and somewhat nausea in the afternoon had some abdominal distention as well.  X-ray abdomen shows evidence of constipation without any obstruction. Will initiate aggressive regimen. Add simethicone  GERD (gastroesophageal reflux disease)- (present on admission) -Continue Pepcid  Hypokalemia- (present on admission) Replaced IV.  High cholesterol- (present on admission) Lipid panel obtained this admission with LDL of 90.  Normocytic anemia- (present on admission) Hemoglobin was 13.7.  Currently trended down to 11.5 likely dilutional in nature. No active bleeding seen from the outside. Will monitor. Transfer for hemoglobin less than 7 or hemodynamic instability. Will initiate lab work-up.  Hypertension- (present on admission) Not on medication as an outpatient. Started on PRN hydralazine inpatient -Continue hydralazine PRN  Tobacco dependence- (present on admission) Tobacco cessation discussed on admission. -Continue nicotine patch  Pedal edema- (present on admission) Doppler negative for DVT.  Encounter for assessment of decision-making capacity Appreciate psychiatry consultation.  On 1/12. Patient has mental capacity for decision-making and has capacity to participate in treatment.  Fever blister Add valtrex for 1 day and topical analgesic.  B12 deficiency- (present on admission) Relatively low. Will replace.    Subjective: reports blisters on lips, pain not well controlled   Objective Vitals:   11/12/21 0500 11/12/21 0900 11/12/21 1329 11/12/21 1429  BP: (!) 145/91 138/82 (!) 144/64 (!) 171/77  Pulse: 72 76 72 72  Resp: 16 18 18 18   Temp: 97.7 F (36.5 C) 97.9 F (36.6 C) 98.3 F  (36.8 C) 98.4 F (36.9 C)  TempSrc: Oral  Oral Oral Oral  SpO2: 98% 96% 97% 100%  Weight:      Height:        General: Appear in mild distress, no Rash; Oral Mucosa Clear, moist. no Abnormal Neck Mass Or lumps, Conjunctiva normal  Cardiovascular: S1 and S2 Present, no Murmur, Respiratory: good respiratory effort, Bilateral Air entry present and CTA, no Crackles, no wheezes Abdomen: Bowel Sound present, Soft and no tenderness Extremities: right Pedal edema improving  Neurology: alert and oriented to time, place, and person affect appropriate. no new focal deficit Gait not checked due to patient safety concerns      Data Reviewed:  My review of labs, imaging, notes and other tests shows no new significant findings.   Family Communication: called wife and all question answered.   Disposition: Status is: Inpatient  Remains inpatient appropriate because: still on iv Antibiotics     Time spent: 35 minutes  Author: Berle Mull, MD 11/12/2021 4:21 PM  For on call review [email or web address].

## 2021-11-12 NOTE — Progress Notes (Signed)
Patient's wife is unstable and accusing staff that worked the beginning of the week of stealing her husband's pain medication. She is happy with the care her husband received this shift. Patient's wife apparently removed his dressing on his foot and stated she was going to let it "air out" until the next shift. She also stated the hospital is ruining her husband's leg. Patient's wife makes irrational, illogical statements throughout the shift.

## 2021-11-12 NOTE — Assessment & Plan Note (Addendum)
Treated with valtrex for 1 day and topical analgesic. Improving. Informed to wife that Remus Blake is not available in formulary.

## 2021-11-12 NOTE — Progress Notes (Signed)
[facility] [place] PROGRESS NOTE  ASSESSMENT / PLAN: Warren Gray is a 66 y.o. male status post right femoropopliteal angioplasty and stenting 11/11/21 for chronic limb threatening ischemia. Good technical result. No issues at access site. Follow up with me in 1 month with ABI and RLE duplex. Please call for questions.   SUBJECTIVE: Wife at bedside and confrontational. I explained operative findings and technical results.   OBJECTIVE: BP (!) 171/77 (BP Location: Right Arm)    Pulse 72    Temp 98.4 F (36.9 C) (Oral)    Resp 18    Ht 5\' 7"  (1.702 m)    Wt 72.6 kg    SpO2 100%    BMI 25.06 kg/m   Intake/Output Summary (Last 24 hours) at 11/12/2021 1704 Last data filed at 11/12/2021 0901 Gross per 24 hour  Intake 393.4 ml  Output 900 ml  Net -506.6 ml    Constitutional: chronically ill appearing. no acute distress. Cardiac: RRR. Pulmonary: unlabored Abdomen: soft, non-distended Vascular: right common femoral access clean and dry. No evidence of hematoma. LLE hyperemic. Well perfused. Capillary refill <2s. Edema prevents pulse exam.   CBC Latest Ref Rng & Units 11/12/2021 11/11/2021 11/11/2021  WBC 4.0 - 10.5 K/uL 8.3 8.0 8.0  Hemoglobin 13.0 - 17.0 g/dL 10.9(L) 10.7(L) 11.0(L)  Hematocrit 39.0 - 52.0 % 33.8(L) 33.2(L) 33.5(L)  Platelets 150 - 400 K/uL 286 258 224     CMP Latest Ref Rng & Units 11/12/2021 11/11/2021 11/10/2021  Glucose 70 - 99 mg/dL 111(H) 124(H) 117(H)  BUN 8 - 23 mg/dL 12 15 11   Creatinine 0.61 - 1.24 mg/dL 0.75 0.58(L) 0.88  Sodium 135 - 145 mmol/L 135 135 134(L)  Potassium 3.5 - 5.1 mmol/L 3.8 3.5 3.3(L)  Chloride 98 - 111 mmol/L 105 102 103  CO2 22 - 32 mmol/L 23 23 23   Calcium 8.9 - 10.3 mg/dL 8.3(L) 8.7(L) 8.4(L)  Total Protein 6.5 - 8.1 g/dL - - -  Total Bilirubin 0.3 - 1.2 mg/dL - - -  Alkaline Phos 38 - 126 U/L - - -  AST 15 - 41 U/L - - -  ALT 0 - 44 U/L - - -    Estimated Creatinine Clearance: 84.9 mL/min (by C-G formula based  on SCr of 0.75 mg/dL).  Yevonne Aline. Stanford Breed, MD [facility] Phone Number: [phone] 11/12/2021 5:04 PM

## 2021-11-13 DIAGNOSIS — R918 Other nonspecific abnormal finding of lung field: Secondary | ICD-10-CM | POA: Diagnosis present

## 2021-11-13 LAB — PROCALCITONIN: Procalcitonin: <0.1 ng/mL

## 2021-11-13 LAB — CBC WITH DIFFERENTIAL/PLATELET
Abs Immature Granulocytes: 0.04 10*3/uL (ref 0.00–0.07)
Basophils Absolute: 0 10*3/uL (ref 0.0–0.1)
Basophils Relative: 1 %
Eosinophils Absolute: 0.1 10*3/uL (ref 0.0–0.5)
Eosinophils Relative: 2 %
HCT: 33.8 % — ABNORMAL LOW (ref 39.0–52.0)
Hemoglobin: 11 g/dL — ABNORMAL LOW (ref 13.0–17.0)
Immature Granulocytes: 1 %
Lymphocytes Relative: 20 %
Lymphs Abs: 1.6 10*3/uL (ref 0.7–4.0)
MCH: 33 pg (ref 26.0–34.0)
MCHC: 32.5 g/dL (ref 30.0–36.0)
MCV: 101.5 fL — ABNORMAL HIGH (ref 80.0–100.0)
Monocytes Absolute: 0.6 10*3/uL (ref 0.1–1.0)
Monocytes Relative: 7 %
Neutro Abs: 5.7 10*3/uL (ref 1.7–7.7)
Neutrophils Relative %: 69 %
Platelets: 305 10*3/uL (ref 150–400)
RBC: 3.33 MIL/uL — ABNORMAL LOW (ref 4.22–5.81)
RDW: 13.6 % (ref 11.5–15.5)
WBC: 8.1 10*3/uL (ref 4.0–10.5)
nRBC: 0 % (ref 0.0–0.2)

## 2021-11-13 LAB — COMPREHENSIVE METABOLIC PANEL
ALT: 67 U/L — ABNORMAL HIGH (ref 0–44)
AST: 47 U/L — ABNORMAL HIGH (ref 15–41)
Albumin: 2.7 g/dL — ABNORMAL LOW (ref 3.5–5.0)
Alkaline Phosphatase: 123 U/L (ref 38–126)
Anion gap: 4 — ABNORMAL LOW (ref 5–15)
BUN: 11 mg/dL (ref 8–23)
CO2: 22 mmol/L (ref 22–32)
Calcium: 7.9 mg/dL — ABNORMAL LOW (ref 8.9–10.3)
Chloride: 104 mmol/L (ref 98–111)
Creatinine, Ser: 0.86 mg/dL (ref 0.61–1.24)
GFR, Estimated: >60 mL/min (ref >60)
Glucose, Bld: 130 mg/dL — ABNORMAL HIGH (ref 70–99)
Potassium: 3.7 mmol/L (ref 3.5–5.1)
Sodium: 130 mmol/L — ABNORMAL LOW (ref 135–145)
Total Bilirubin: 0.6 mg/dL (ref 0.3–1.2)
Total Protein: 6.8 g/dL (ref 6.5–8.1)

## 2021-11-13 LAB — GLUCOSE, CAPILLARY
Glucose-Capillary: 111 mg/dL — ABNORMAL HIGH (ref 70–99)
Glucose-Capillary: 99 mg/dL (ref 70–99)
Glucose-Capillary: 138 mg/dL — ABNORMAL HIGH (ref 70–99)
Glucose-Capillary: 157 mg/dL — ABNORMAL HIGH (ref 70–99)

## 2021-11-13 LAB — MAGNESIUM: Magnesium: 2.3 mg/dL (ref 1.7–2.4)

## 2021-11-13 LAB — MRSA NEXT GEN BY PCR, NASAL: MRSA by PCR Next Gen: NOT DETECTED

## 2021-11-13 MED ORDER — DOXYCYCLINE HYCLATE 100 MG PO TABS
100.0000 mg | ORAL_TABLET | Freq: Two times a day (BID) | ORAL | Status: DC
  Administered 2021-11-13 – 2021-11-14 (×2): 100 mg via ORAL
  Filled 2021-11-13 (×2): qty 1

## 2021-11-13 MED ORDER — CEFDINIR 300 MG PO CAPS
300.0000 mg | ORAL_CAPSULE | Freq: Two times a day (BID) | ORAL | Status: DC
  Administered 2021-11-13 – 2021-11-14 (×2): 300 mg via ORAL
  Filled 2021-11-13 (×2): qty 1

## 2021-11-13 NOTE — Progress Notes (Signed)
Patients wife called out saying that her husband was bleeding from the mouth and she was scared and needed the nurse, I went directly back to his room to assess, upon entering patient was wiping his mouth on the bedsheet which had a scant amount of blood on it. I looked inside patients mouth and there were no areas bleeding on the tongue or gums, patient has a few fever blisters on upper and lower lips that are bleeding a small amount at this time. Gave warm washrag, changed bedsheet, and offered patient carmex. Wife at bedside saying " I'm gonna have to take him somewhere else, he got this virus thing on his lips after surgery, I don't know who he had been around, he could have an STD and I don't wanna catch that", also asking that we provide Abreva for patient to which I said we do not have. She also kept repeating " You are going to document this right?", to which I let her know that it would be included in my assessment charting. She proceeded to keep talking about how she thought we were supposed to culture "that shit" on his lips and something's not right with it. Wife continued to limit my conversation with the patient by always responding even when conversation is not directed at her. Patient becoming irritated and cursing loudly at wife because she will not let him get a word in otherwise. Patient stated "we are not fucking doing this again tonight Clydie Braun, you need to fucking stop". Reassured patient that I would get him some Carmex and help apply it to his lips.

## 2021-11-13 NOTE — Progress Notes (Signed)
Attempted to do patients dressing change when going in to hang his antibiotic, I spoke to patient first and he was agreeable, however, his wife woke up at that time and asked what I was going to do. After explaining I was going to hang his antibiotic and complete his dressing change she stated "he don't need it, that little girl changed it a few times already today", I was told in shift report that the dressing had been done 3 or 4 times on dayshift because the wife kept removing the dressing and then calling for it to be redressed. Wife the stood up and came beside me at the computer and said "he needs some of that pain medication too", I proceeded to assess the patients pain level to which he said it was a 10/10, I left to get his prn pain med per the [facility], and when I returned and scanned the medication, the wife came over to me and wanted to see the packaging and read the drug name on it, patient seemed irritated at her and asked her to "let the nurse do her job", however she continued to control the entire conversation and interaction while I was in the room. To note, I also found a large tooth (root and all) sitting on the bedside table- patient stated that he "popped it out" because it was hurting, will continue to monitor and assess patients needs.

## 2021-11-13 NOTE — Progress Notes (Signed)
Progress Note   Patient: Warren Gray [id number] DOB: 03-08-1956 DOA: 11/06/2021     6 DOS: the patient was seen and examined on 11/13/2021   Brief hospital course: 66 year old male with past medical history of type II DM, HTN, HLD.  Presents with complaints of right leg pain and edema found to have diabetic foot infection with cellulitis of the right leg.  Work-up so far is negative for osteomyelitis but consistent with critical limb ischemia of the right leg. Vascular surgery consulted. 1/11 angiography canceled due to family issues.  Assessment and Plan * Diabetic foot infection ([place])- (present on admission) Right foot with cellulitis extending up lower leg. Started empirically on Ceftriaxone IV.  MRI obtained and was negative for acute osteomyelitis.  RLE venous duplex negative for acute DVT x2.  ABI significant for moderate arterial disease.  Blood cultures obtained on admission and are so far not growing any organism. PT evaluated with recommendation for no follow-up. Had an episode of fever on 1/13. Was on ceftriaxone now on cefepime and vancomycin. Continue to follow blood culture data, no growth so far on  repeat cultures. Underwent angiography with right fempop stenting and angioplasty on 1/13. Anticipating swelling to improve gradually over period of time. Patient will benefit from outpatient Unna boot/compression stockings once cellulitis resolves.  Critical limb ischemia of right lower extremity ([place])- (present on admission) Chronic limb threatening ischemia. SP right femoropopliteal angioplasty and stenting. Good technical results per vascular surgery. Follow-up with Dr. Percival Spanish in 1 month with ABI and right lower extremity Doppler. Currently on aspirin and Lipitor.  Diabetes mellitus type 2, controlled ([place]) Not on medication as an outpatient. Hemoglobin A1C of 6.1% this admission. Sugars controlled -Carb modified diet Would not continue sliding scale given his  CBGs are normal.  Fever Most likely postprocedure atelectasis induced fever. Started after angioplasty of 1/13. Chest x-ray shows possible left lower lobe infiltrate. Patient has some shortness of breath. Currently antibiotic regimen was broadened to include vancomycin and cefepime. Blood cultures performed as well. UA normal procalcitonin normal Doppler negative again  Constipation- (present on admission) X-ray abdomen shows evidence of constipation without any obstruction. Continue current regimen.  GERD (gastroesophageal reflux disease)- (present on admission) -Continue Pepcid  Hypokalemia- (present on admission) Replaced IV.  High cholesterol- (present on admission) Lipid panel obtained this admission with LDL of 90.  B12 deficiency- (present on admission) Relatively low.  Continue replacement.   Macrocytic anemia- (present on admission) Hemoglobin was 13.7.  Currently trended down to 11.  Likely dilutional in nature.  Now stable. No active bleeding seen from the outside. Reticulocyte count normal. Iron level marginally low.  B12 292.  Hypertension- (present on admission) Not on medication as an outpatient. Started on PRN hydralazine inpatient -Continue hydralazine PRN  Tobacco dependence- (present on admission) Tobacco cessation discussed on admission. -Continue nicotine patch  Pedal edema- (present on admission) Doppler negative for DVT.  Lung nodule, multiple- (present on admission) Patient had outpatient lungs cancer screening test on 1/6. Study report shows 53mm pulmonary nodule in the right lower lobe as well as 5.5 cm mixed attenuation lesion on the left lower lobe.  This is the lesion that appears to be pneumonia on the chest x-ray. This will require further work-up including pulmonary follow-up. I am not sure if the PCP has a chance to discuss this finding with the patient.  I have not discussed this finding with the patient yet.  I will discuss with  patient about the finding and arrange  pulmonary follow-up. Addendum: I have discussed with the patient privately.  Patient is alert awake and oriented x3.  Was eating his lunch.  I have asked him if he remembers going through a CT scan, what was the indication for the CT scan and who ordered the CT scan of the chest.  Patient able to answer the questions appropriately.  I have informed him that there are 2 lung nodules seen on the CT scan which is concerning for lung cancer and patient will benefit from further work-up including referral to pulmonary as well as possible outpatient PET scan.  I have also informed him that his Plavix may need to be held for 5 days before biopsy and given that the patient has recently had a stent placed he may need to be on Plavix for at least a month or so.  This is and will be up to vascular surgery.  I have asked the patient what would he like me to do going forward. Patient prefers that we focus on his leg infection and the idea of getting him home tomorrow. Patient prefers to discuss with his PCP about this finding before getting a referral for pulmonary or for biopsy or for PET scan. Patient prefers that I hold his information between [place] only and do not share it with his family members. Patient would like to discuss with me privately later about any other ideas that he may have. I have informed the RN about about discussion and the findings and patient's preference.  I requested her to notify me when the patient is ready to discuss further.  Berle Mull 1:42 PM 11/13/2021    Encounter for assessment of decision-making capacity Appreciate psychiatry consultation.  On 1/12. Patient has mental capacity for decision-making and has capacity to participate in treatment.  Fever blister Treated with valtrex for 1 day and topical analgesic. Improving. Informed to wife that Twanna Hy is not available in formulary.     Subjective: Continues to have swelling of the right  leg.  Reports pain.  At the time of my evaluation just received some pain medication.  No nausea no vomiting.  Passing gas.  Small BM.  No diarrhea.  Objective  Vitals:   11/12/21 0900 11/12/21 1329 11/12/21 1429 11/13/21 0541  BP: 138/82 (!) 144/64 (!) 171/77 133/74  Pulse: 76 72 72 71  Resp: 18 18 18 18   Temp: 97.9 F (36.6 C) 98.3 F (36.8 C) 98.4 F (36.9 C) 98.8 F (37.1 C)  TempSrc: Oral Oral Oral Oral  SpO2: 96% 97% 100% 97%  Weight:      Height:        General: Appear in mild distress, erythema improving in the right leg, facial fever blister improving, no other rash; Oral Mucosa Clear, moist. no Abnormal Neck Mass Or lumps, Conjunctiva normal  Cardiovascular: S1 and S2 Present, no Murmur, Respiratory: good respiratory effort, Bilateral Air entry present and CTA, no Crackles, no wheezes Abdomen: Bowel Sound present, Soft and no tenderness Extremities: Right leg edema improving. Neurology: alert and oriented to time, place, and person affect appropriate. no new focal deficit Gait not checked due to patient safety concerns  Data Reviewed:  CBC shows WBC normal, hemoglobin stable.  Platelet normal.  CMP shows mild elevation of LFT.  Sodium 130.  Family Communication: Wife at bedside.  Disposition: Status is: Inpatient  Remains inpatient appropriate because: Still on IV antibiotics.  Will transition to p.o. depending on the culture results.     Time  spent: 50 minutes  Author: Berle Mull, MD 11/13/2021 12:06 PM  For on call review [email or web address].

## 2021-11-13 NOTE — Assessment & Plan Note (Addendum)
Patient had outpatient lungs cancer screening test on 1/6. Study report shows 64mm pulmonary nodule in the right lower lobe as well as 5.5 cm mixed attenuation lesion on the left lower lobe.  This is the lesion that appears to be pneumonia on the chest x-ray. This will require further work-up including pulmonary follow-up. I am not sure if the PCP has a chance to discuss this finding with the patient. I have discussed with the patient privately about this finding. I asked him if he remembers going through a CT scan and asked what was the indication for the CT scan and who ordered the CT scan of the chest.  Patient able to answer those questions appropriately. I have informed him that there are 2 lung nodules seen on the CT scan which is concerning for lung cancer and patient will benefit from further work-up including referral to pulmonary as well as possible outpatient PET scan. I have also informed him that his Plavix may need to be held for 5 days before biopsy and given that the patient has recently had a stent placed he may need to be on Plavix for at least a month or so. This will be up to vascular surgery. I have asked the patient what would he like me to do going forward. Patient prefers that we focus on his leg infection for now Patient prefers to discuss with his PCP about this finding before getting a referral for pulmonary or for biopsy or for PET scan. Patient prefers that I hold his information between [place] only and do not share it with his family members. I have informed the RN about about discussion and the findings and patient's preference. I tried to reach the PCP's office and left a message as well as my contact information, but have not been able to discuss with them due to holiday.

## 2021-11-13 NOTE — Plan of Care (Signed)
  Problem: Education: Goal: Knowledge of General Education information will improve Description: Including pain rating scale, medication(s)/side effects and non-pharmacologic comfort measures Outcome: Progressing   Problem: Activity: Goal: Risk for activity intolerance will decrease Outcome: Progressing   Problem: Pain Managment: Goal: General experience of comfort will improve Outcome: Progressing   

## 2021-11-14 ENCOUNTER — Other Ambulatory Visit (HOSPITAL_COMMUNITY): Payer: Self-pay

## 2021-11-14 ENCOUNTER — Encounter (HOSPITAL_COMMUNITY): Payer: Self-pay | Admitting: Vascular Surgery

## 2021-11-14 LAB — GLUCOSE, CAPILLARY
Glucose-Capillary: 112 mg/dL — ABNORMAL HIGH (ref 70–99)
Glucose-Capillary: 130 mg/dL — ABNORMAL HIGH (ref 70–99)

## 2021-11-14 MED ORDER — POLYETHYLENE GLYCOL 3350 17 G PO PACK
17.0000 g | PACK | Freq: Every day | ORAL | 0 refills | Status: AC
  Filled 2021-11-14: qty 14, 14d supply, fill #0

## 2021-11-14 MED ORDER — CLOPIDOGREL BISULFATE 75 MG PO TABS
75.0000 mg | ORAL_TABLET | Freq: Every day | ORAL | 0 refills | Status: AC
Start: 2021-11-15 — End: ?
  Filled 2021-11-14: qty 60, 60d supply, fill #0

## 2021-11-14 MED ORDER — ASPIRIN 81 MG PO TBEC
81.0000 mg | DELAYED_RELEASE_TABLET | Freq: Every day | ORAL | 0 refills | Status: AC
  Filled 2021-11-14: qty 120, 120d supply, fill #0

## 2021-11-14 MED ORDER — SIMETHICONE 80 MG PO CHEW
80.0000 mg | CHEWABLE_TABLET | Freq: Four times a day (QID) | ORAL | 0 refills | Status: AC
  Filled 2021-11-14: qty 30, 8d supply, fill #0

## 2021-11-14 MED ORDER — METHOCARBAMOL 500 MG PO TABS
500.0000 mg | ORAL_TABLET | Freq: Three times a day (TID) | ORAL | 0 refills | Status: AC | PRN
  Filled 2021-11-14: qty 15, 5d supply, fill #0

## 2021-11-14 MED ORDER — CEFDINIR 300 MG PO CAPS
300.0000 mg | ORAL_CAPSULE | Freq: Two times a day (BID) | ORAL | 0 refills | Status: AC
  Filled 2021-11-14: qty 14, 7d supply, fill #0

## 2021-11-14 MED ORDER — ATORVASTATIN CALCIUM 80 MG PO TABS
80.0000 mg | ORAL_TABLET | Freq: Every day | ORAL | 0 refills | Status: AC
Start: 2021-11-15 — End: ?
  Filled 2021-11-14: qty 30, 30d supply, fill #0

## 2021-11-14 MED ORDER — GLUCERNA SHAKE PO LIQD
237.0000 mL | Freq: Two times a day (BID) | ORAL | 0 refills | Status: AC
  Filled 2021-11-14: qty 10000, 21d supply, fill #0

## 2021-11-14 MED ORDER — DOXYCYCLINE HYCLATE 100 MG PO TABS
100.0000 mg | ORAL_TABLET | Freq: Two times a day (BID) | ORAL | 0 refills | Status: AC
  Filled 2021-11-14: qty 4, 2d supply, fill #0

## 2021-11-14 MED ORDER — ADULT MULTIVITAMIN W/MINERALS CH
1.0000 | ORAL_TABLET | Freq: Every day | ORAL | 0 refills | Status: AC
  Filled 2021-11-14: qty 150, 150d supply, fill #0

## 2021-11-14 MED ORDER — LIP MEDEX EX OINT
TOPICAL_OINTMENT | CUTANEOUS | Status: DC | PRN
  Filled 2021-11-14: qty 7

## 2021-11-14 MED ORDER — OXYCODONE-ACETAMINOPHEN 10-325 MG PO TABS
1.0000 | ORAL_TABLET | Freq: Four times a day (QID) | ORAL | 0 refills | Status: AC | PRN
Start: 2021-11-14 — End: 2022-11-14
  Filled 2021-11-14: qty 20, 5d supply, fill #0

## 2021-11-14 MED ORDER — NICOTINE 14 MG/24HR TD PT24
14.0000 mg | MEDICATED_PATCH | Freq: Every day | TRANSDERMAL | 0 refills | Status: AC | PRN
  Filled 2021-11-14: qty 28, 28d supply, fill #0

## 2021-11-14 MED ORDER — CYANOCOBALAMIN 1000 MCG PO TABS
1000.0000 ug | ORAL_TABLET | Freq: Every day | ORAL | 0 refills | Status: AC
Start: 2021-11-15 — End: ?
  Filled 2021-11-14: qty 120, 120d supply, fill #0

## 2021-11-14 NOTE — Plan of Care (Signed)
  Problem: Coping: Goal: Level of anxiety will decrease Outcome: Progressing   Problem: Pain Managment: Goal: General experience of comfort will improve Outcome: Progressing   Problem: Safety: Goal: Ability to remain free from injury will improve Outcome: Progressing   

## 2021-11-14 NOTE — TOC Transition Note (Signed)
Transition of Care [facility]) - CM/SW Discharge Note  Patient Details  Name: Warren Gray MRN: [id number] Date of Birth: 01/10/1956  Transition of Care [facility]) CM/SW Contact:  Sherie Don, LCSW Phone Number: 11/14/2021, 11:22 AM  Clinical Narrative: CSW met with patient and his wife, Warren Gray, per her request. Wife became angry and aggressive in the room and had to be asked by the patient to sit down as her behavior has been an issue during previous visits at the hospital. CSW explained that rehab was not an option as patient does not meet criteria and going to a SNF private pay would require that patient pay for his stay up front. CSW offered [facility] and informed patient and wife address for discharge would be needed because the [facility] agencies determine availability set up by zip code. Both refused to provide a address, so CSW told patient he can call his PCP after d/c to set up [facility], but it would be faster if CSW referred the patient prior to discharge. Both still declined to provide an address. Wife demanded TOC cover the cost of patient's medication. CSW explained that as patient has insurance, a [employer] could not be provided. Patient became angry with wife for arguing "since I've been up." Patient and wife are awaiting his boots and medications to be delivered by the [place]. Wife stated they said are leaving after the boots and medications are delivered "and you are never coming back to this hospital." Hospitalist updated. TOC signing off.  Final next level of care: Home/Self Care Barriers to Discharge: Barriers Resolved  Patient Goals and CMS Choice Patient states their goals for this hospitalization and ongoing recovery are:: Return to [place], [place] [employer] Compare Post Acute Care list provided to:: Patient Choice offered to / list presented to : Patient  Discharge Plan and Services In-house Referral: Clinical Social Work Post Acute Care Choice: Durable Medical  Equipment          DME Arranged: Gilford Rile rolling DME Agency: [employer] Date DME Agency Contacted: 11/10/21 Representative spoke with at DME Agency: [place]  Readmission Risk Interventions Readmission Risk Prevention Plan 11/10/2021  Transportation Screening Complete  [place] or [place] Complete  SW Recovery Care/Counseling Consult Complete  Palliative Care Screening Not [place] Not Applicable  Some recent data might be hidden

## 2021-11-14 NOTE — Progress Notes (Signed)
Orthopedic Tech Progress Note Patient Details:  Warren Gray 1956-01-25 [id number]  Patient ID: Warren Gray, male   DOB: [birth year], 66 y.o.   MRN: [id number] Asked patient what size shoe he wore, so I could provide the correct post op shoe. He asked to see it and then proceeded to sling it across the room and said "get this out of my fucking room." His wife then started cursing at me as well.  [place] A Maryana Pittmon 11/14/2021, 10:09 AM

## 2021-11-14 NOTE — Discharge Summary (Signed)
Physician Discharge Summary   Patient: Warren Gray MRN: [id number] DOB: 01-07-1956  Admit date:     11/06/2021  Discharge date: 11/14/21  Discharge Physician: Berle Mull   PCP: Loura Pardon, MD   Recommendations at discharge:   Follow up with PCP in 1 week, will need work up on lung nodule.  Follow up with vascular surgery  Pt had preferred to keep the information about lung nodule to himself for now.   Discharge Diagnoses Principal Problem:   Diabetic foot infection ([place]) Active Problems:   Leucocytosis   CRP elevated   Diabetes mellitus type 2, controlled (HCC)   Critical limb ischemia of right lower extremity (HCC)   GERD (gastroesophageal reflux disease)   Constipation   Fever   High cholesterol   Hypokalemia   Hypertension   Macrocytic anemia   B12 deficiency   Tobacco dependence   Pedal edema   Encounter for assessment of decision-making capacity   Lung nodule, multiple   Fever blister  Resolved Problems:   * No resolved hospital problems. [facility]   66 year old male with past medical history of type II DM, HTN, HLD.  Presents with complaints of right leg pain and edema found to have diabetic foot infection with cellulitis of the right leg. 1/11 angiography canceled due to family issues. 1/13 underwent Right femoropopliteal angioplasty and stenting. Had episode of fever 1/14 developed fever blister.  1/16 remained fever free for 48 hours. Fever free for 24 hours on oral Antibiotics   * Diabetic foot infection ([place])- (present on admission) Right foot with cellulitis extending up lower leg. Started empirically on Ceftriaxone IV.  MRI obtained and was negative for acute osteomyelitis.  RLE venous duplex negative for acute DVT x2.  ABI significant for moderate arterial disease.  Blood cultures obtained on admission and are so far not growing any organism. PT evaluated with recommendation for no follow-up. Had an episode of fever on 1/13. Was  on ceftriaxone and then broaden to cefepime and vancomycin. Now on oral omnicef and doxycycline.  Continue to follow blood culture data, no growth so far on  repeat cultures. Underwent angiography with right fempop stenting and angioplasty on 1/13. Anticipating swelling to improve gradually over period of time. Patient will benefit from outpatient Unna boot/compression stockings. Ordered in hospital but they were out of supplies. Pt was provided cam walker boot at his request.   Critical limb ischemia of right lower extremity ([place])- (present on admission) Chronic limb threatening ischemia. SP right femoropopliteal angioplasty and stenting. Good technical results per vascular surgery. Follow-up with Dr. Percival Spanish in 1 month with ABI and right lower extremity Doppler. Currently on aspirin, plavix and Lipitor.  Diabetes mellitus type 2, controlled ([place]) Not on medication as an outpatient. Hemoglobin A1C of 6.1% this admission. Sugars controlled -Carb modified diet  Fever Most likely postprocedure atelectasis induced fever. Started after angioplasty of 1/13. Chest x-ray shows possible left lower lobe infiltrate. Patient has some shortness of breath. antibiotic regimen was broadened to include vancomycin and cefepime. Blood cultures performed as well. Negative for 3 days.  UA normal procalcitonin normal Doppler negative again Now on oral Antibiotics  Constipation- (present on admission) X-ray abdomen shows evidence of constipation without any obstruction. Continue current regimen.  GERD (gastroesophageal reflux disease)- (present on admission) -Continue Pepcid  Hypokalemia- (present on admission) Replaced IV.  High cholesterol- (present on admission) Lipid panel obtained this admission with LDL of 90.  B12 deficiency- (present on admission) Relatively low.  Continue replacement.   Macrocytic anemia- (present on admission) Hemoglobin was 13.7.  Currently trended down to 11.   Likely dilutional in nature.  Now stable. No active bleeding seen from the outside. Reticulocyte count normal. Iron level marginally low.  B12 292.  Hypertension- (present on admission) Not on medication as an outpatient. Started on PRN hydralazine inpatient -Continue hydralazine PRN  Tobacco dependence- (present on admission) Tobacco cessation discussed on admission. -Continue nicotine patch  Pedal edema- (present on admission) Doppler negative for DVT.  Lung nodule, multiple- (present on admission) Patient had outpatient lungs cancer screening test on 1/6. Study report shows 16mm pulmonary nodule in the right lower lobe as well as 5.5 cm mixed attenuation lesion on the left lower lobe.  This is the lesion that appears to be pneumonia on the chest x-ray. This will require further work-up including pulmonary follow-up. I am not sure if the PCP has a chance to discuss this finding with the patient. I have discussed with the patient privately about this finding. I asked him if he remembers going through a CT scan and asked what was the indication for the CT scan and who ordered the CT scan of the chest.  Patient able to answer those questions appropriately. I have informed him that there are 2 lung nodules seen on the CT scan which is concerning for lung cancer and patient will benefit from further work-up including referral to pulmonary as well as possible outpatient PET scan. I have also informed him that his Plavix may need to be held for 5 days before biopsy and given that the patient has recently had a stent placed he may need to be on Plavix for at least a month or so. This will be up to vascular surgery. I have asked the patient what would he like me to do going forward. Patient prefers that we focus on his leg infection for now Patient prefers to discuss with his PCP about this finding before getting a referral for pulmonary or for biopsy or for PET scan. Patient prefers that I  hold his information between [place] only and do not share it with his family members. I have informed the RN about about discussion and the findings and patient's preference. I tried to reach the PCP's office and left a message as well as my contact information, but have not been able to discuss with them due to holiday.  Encounter for assessment of decision-making capacity Appreciate psychiatry consultation.  On 1/12. Patient has mental capacity for decision-making and has capacity to participate in treatment.  Fever blister Treated with valtrex for 1 day and topical analgesic. Improving. Informed to wife that Remus Blake is not available in formulary.      Pain control - [employer] Controlled Substance Reporting System database was reviewed. and patient was instructed, not to drive, operate heavy machinery, perform activities at heights, swimming or participation in water activities or provide baby-sitting services while on Pain, Sleep and Anxiety Medications; until their outpatient Physician has advised to do so again. Also recommended to not to take more than prescribed Pain, Sleep and Anxiety Medications.   Consultants: vascular surgery  Psychiatry Procedures performed: Right femoropopliteal angioplasty and stenting   Disposition: Home health wife refused to provide a address so we were not able to arrange home health Diet recommendation: Carb modified diet  DISCHARGE MEDICATION: Allergies as of 11/14/2021       Reactions   Fish Allergy Swelling   Shellfish Allergy Swelling  Naproxen Itching   Zoloft [sertraline Hcl] Diarrhea        Medication List     TAKE these medications    aspirin 81 MG EC tablet Take 1 tablet (81 mg total) by mouth daily. Swallow whole. Start taking on: November 15, 2021   atorvastatin 80 MG tablet Commonly known as: LIPITOR Take 1 tablet  by mouth daily. Start taking on: November 15, 2021   cefdinir 300 MG capsule Commonly known as:  OMNICEF Take 1 capsule by mouth 2  times daily for 7 days.   clopidogrel 75 MG tablet Commonly known as: PLAVIX Take 1 tablet by mouth daily with breakfast. Start taking on: November 15, 2021   cyanocobalamin 1000 MCG tablet Take 1 tablet  by mouth daily. Start taking on: November 15, 2021   doxycycline 100 MG tablet Commonly known as: VIBRA-TABS Take 1 tablet  by mouth 2  times daily for 2 days.   famotidine 20 MG tablet Commonly known as: PEPCID Take 1 tablet (20 mg total) by mouth 2 (two) times daily.   feeding supplement (GLUCERNA SHAKE) Liqd Take 237 mLs by mouth 2 (two) times daily between meals.   methocarbamol 500 MG tablet Commonly known as: ROBAXIN Take 1 tablet  by mouth every 8 hours as needed for muscle spasms.   multivitamin with minerals Tabs tablet Take 1 tablet by mouth daily. Start taking on: November 15, 2021   nicotine 14 mg/24hr patch Commonly known as: NICODERM CQ - dosed in mg/24 hours Place 1 patch (14 mg total) onto the skin daily as needed (tobacco dependence).   oxyCODONE-acetaminophen 10-325 MG tablet Commonly known as: Percocet Take 1 tablet by mouth every 6 hours as needed for pain.   polyethylene glycol 17 g packet Commonly known as: MIRALAX / GLYCOLAX Take 17 g by mouth daily.   simethicone 80 MG chewable tablet Commonly known as: MYLICON Chew 1 tablet (80 mg total) by mouth 4 (four) times daily.               Discharge Care Instructions  (From admission, onward)           Start     Ordered   11/14/21 0000  Discharge wound care:       Comments: Wound care to right foot ulcer, full thickness:  Cleanse with soap and water, also between digits, rinse and pat gently, pat thoroughly dry. Place a size appropriate piece of folded xeroform gauze over lesion, top with dry gauze pads and secure with a few turns of Kerlix roll gauze/paper tape.  Float heels.   11/14/21 1028            Follow-up Information     Paliwal,  Himanshu, MD. Schedule an appointment as soon as possible for a visit in 1 week(s).   Specialty: Family Medicine Contact information: [place] [address] [phone]         [name], MD. Schedule an appointment as soon as possible for a visit in 2 week(s).   Specialties: Vascular Surgery, Interventional Cardiology Contact information: [address] [place] [address] [phone]                 Discharge Exam: [facility] Weights   11/06/21 2304 11/07/21 0437  Weight: 72.6 kg 72.6 kg   Vitals:   11/13/21 1346 11/13/21 2137 11/14/21 0310 11/14/21 1340  BP: 132/66 134/69 121/64 (!) 167/84  Pulse: 69 71 69 74  Resp: 16 14 14  16  Temp: 98.2 F (36.8 C) 99.9 F (37.7 C) (!) 97.5 F (36.4 C)   TempSrc:  Oral Oral   SpO2: 98% 96% 97% 100%  Weight:      Height:       General: Appear in mild distress, no Rash; Oral Mucosa Clear, moist. no Abnormal Neck Mass Or lumps, Conjunctiva normal. Pt has been verbally abusive. Cardiovascular: S1 and S2 Present, no Murmur, Respiratory: good respiratory effort, Bilateral Air entry present and CTA, no Crackles, no wheezes Abdomen: Bowel Sound present, Soft and no tenderness Extremities: right Pedal edema Neurology: alert and oriented to time, place, and person affect irritable. no new focal deficit Gait not checked due to patient safety concerns   Condition at discharge: stable  The results of significant diagnostics from this hospitalization (including imaging, microbiology, ancillary and laboratory) are listed below for reference.   Imaging Studies: MR FOOT RIGHT W CONTRAST  Result Date: 11/07/2021 CLINICAL DATA:  Posterior foot and ankle pain, diabetic foot wound EXAM: MRI OF THE RIGHT FOOT WITH CONTRAST TECHNIQUE: Multiplanar, multisequence MR imaging of the right hindfoot was performed following the administration of intravenous contrast. CONTRAST:  59mL GADAVIST GADOBUTROL 1 MMOL/ML IV SOLN  COMPARISON:  X-ray 11/07/2021 FINDINGS: Technical Note: Despite efforts by the technologist and patient, motion artifact is present on today's exam and could not be eliminated. This reduces exam sensitivity and specificity. Bones/Joint/Cartilage No evidence of acute fracture or malalignment. No focal area of bone destruction or erosion. No bone marrow edema or periostitis is evident. No significant arthropathy involving the ankle or midfoot. No joint effusions. Ligaments Ligamentous structures of the ankle and midfoot appear intact. Muscles and Tendons Mild distal Achilles tendinosis with mild peritendinitis. Flexor and extensor tendons of the ankle appear intact. No tenosynovitis. No intramuscular abnormality. Soft tissues Soft tissue edema with skin thickening at the posterior aspect of the lower leg and ankle. Associated soft tissue enhancement. No organized fluid collections. No deep soft tissue ulceration. No enhancing mass. IMPRESSION: 1. Motion degraded exam. 2. Soft tissue edema with skin thickening and enhancement at the posterior aspect of the lower leg and ankle most consistent with cellulitis. No organized fluid collections. 3. No evidence of acute osteomyelitis. 4. Mild distal Achilles tendinosis with peritendinitis. Electronically Signed   By: Davina Poke D.O.   On: 11/07/2021 12:53   PERIPHERAL VASCULAR CATHETERIZATION  Result Date: 11/11/2021 DATE OF SERVICE: 11/11/2021  PATIENT:  Warren Gray  66 y.o. male  PRE-OPERATIVE DIAGNOSIS:  Atherosclerosis of native arteries of right lower extremity causing ulceration  POST-OPERATIVE DIAGNOSIS:  Same  PROCEDURE:  1) US guided left common femoral artery access 2) Aortogram 3) Right lower extremity angiogram with third order cannulation (131mL total contrast) 4) Right femoropopliteal angioplasty and stenting (6x19mm Eluvia) 5) Conscious sedation (39 minutes)  SURGEON:  Yevonne Aline. Stanford Breed, MD  ASSISTANT: none  ANESTHESIA:   local and IV sedation   ESTIMATED BLOOD LOSS: minimal  LOCAL MEDICATIONS USED:  LIDOCAINE  COUNTS: confirmed correct.  PATIENT DISPOSITION:  PACU - hemodynamically stable.  Delay start of Pharmacological VTE agent (>24hrs) due to surgical blood loss or risk of bleeding: no  INDICATION FOR PROCEDURE: Warren Gray is a 66 y.o. male with right foot ulceration and infection in setting of diabetes. After careful discussion of risks, benefits, and alternatives the patient was offered angiography with possible intervention The patient understood and wished to proceed.  OPERATIVE FINDINGS: Terminal aorta and iliac arteries: Widely patent without flow limiting stenosis  Right lower extremity: Common femoral artery: Widely patent without flow limiting stenosis Profunda femoris artery: Widely patent without flow limiting stenosis Superficial femoral artery: Distal aspect with diffuse disease Popliteal artery: above knee segment diffusely diseased; greatest stenosis 80%. Behind and below knee segment without flow limiting stenosis Anterior tibial artery: Widely patent without flow limiting stenosis Tibioperoneal trunk: Widely patent without flow limiting stenosis Peroneal artery: Widely patent without flow limiting stenosis Posterior tibial artery: Widely patent without flow limiting stenosis Pedal circulation: not well visualized  Left lower extremity: Common femoral artery: Widely patent without flow limiting stenosis Profunda femoris artery: Widely patent without flow limiting stenosis Superficial femoral artery: Mild diffuse disease Popliteal artery: Mild diffuse disease; greatest stenosis ~40% above the knee. Anterior tibial artery: Not well visualized, but appears widely patent Tibioperoneal trunk: Not well visualized, but appears widely patent Peroneal artery: Not well visualized, but appears widely patent Posterior tibial artery: Not well visualized, but appears widely patent s Pedal circulation: not evaluated  DESCRIPTION OF PROCEDURE: After  identification of the patient in the pre-operative holding area, the patient was transferred to the operating room. The patient was positioned supine on the operating room table. Anesthesia was induced. The groins was prepped and draped in standard fashion. A surgical pause was performed confirming correct patient, procedure, and operative location.  The left groin was anesthetized with subcutaneous injection of 1% lidocaine. Using ultrasound guidance, the left common femoral artery was accessed with micropuncture technique. Fluoroscopy was used to confirm cannulation over the femoral head. The 78F sheath was upsized to 154F.  A Benson wire was advanced into the distal aorta. Over the wire an omni flush catheter was advanced to the level of L2. Aortogram was performed - see above for details. Bilateral lower extremity runoff angiography was performed - see above for details.  The right common iliac artery was selected with a glidewire advantage. The wire was advanced into the common femoral artery. Over the wire the omni flush catheter was advanced into the external iliac artery. Selective angiography was performed - see above for details.  The decision was made to intervene The patient was heparinized with 7,000 units of heparin. The 154F sheath was exchanged for a 54F x 45cm sheath. Selective angiography of the left lower extremity was performed prior to intervention.  The lesions were treated with: Angioplasty and stenting (6x132mm Eluvia post dilated with 5x120mm mustang)  Completion angiography revealed: Resolution of femoropopliteal stenosis.  A perclose device was used to close the arteriotomy. Hemostasis was excellent upon completion.  Conscious sedation was administered with the use of IV fentanyl and midazolam under continuous physician and nurse monitoring.  Heart rate, blood pressure, and oxygen saturation were continuously monitored.  Total sedation time was 39 minutes  Upon completion of the case instrument  and sharps counts were confirmed correct. The patient was transferred to the PACU in good condition. I was present for all portions of the procedure.  PLAN: ASA 81mg  PO QD indefinitely. Plavix 300mg  PO QD now. Plavix 75mg  PO QD x 12 months post procedure (stop date 11/11/22). High intensity statin therapy indefinitely. Ok to transfer back to [employer] after bedrest.  Yevonne Aline. Stanford Breed, MD [facility] Phone Number: [phone] 11/11/2021 9:48 AM    DG CHEST PORT 1 VIEW  Result Date: 11/11/2021 CLINICAL DATA:  Fevers, history of recent right leg surgery, initial encounter EXAM: PORTABLE CHEST 1 VIEW COMPARISON:  10/19/2021 FINDINGS: Cardiac shadow is within normal limits.  Lungs are well aerated bilaterally. Left basilar infiltrate is noted new from the prior exam. No sizable effusion is seen. No bony abnormality is noted. IMPRESSION: New left basilar infiltrate. Electronically Signed   By: Inez Catalina M.D.   On: 11/11/2021 19:03   DG Chest Portable 1 View  Result Date: 10/19/2021 CLINICAL DATA:  Shortness of breath EXAM: PORTABLE CHEST 1 VIEW COMPARISON:  None. FINDINGS: The heart size and mediastinal contours are within normal limits. Persistent left lower lung opacity. The visualized skeletal structures are unremarkable. IMPRESSION: Persistent left lower lung opacity, likely due to left lower lobe pneumonia. No new parenchymal findings. Recommend follow-up in 3-4 weeks to ensure resolution. Electronically Signed   By: Yetta Glassman M.D.   On: 10/19/2021 16:41   DG Abd Portable 1V  Result Date: 11/09/2021 CLINICAL DATA:  Abdominal distention EXAM: PORTABLE ABDOMEN - 1 VIEW COMPARISON:  12/24/2020 FINDINGS: Bowel gas pattern is nonspecific. Moderate amount of stool is seen in colon. There is no fecal impaction in the rectosigmoid. No abnormal masses or calcifications are seen. Kidneys are partly obscured by bowel contents. Degenerative changes are noted in the  thoracic and lumbar spine. IMPRESSION: Nonspecific bowel gas pattern. Moderate amount of stool is seen in the colon. There are no signs of fecal impaction in the rectosigmoid. Electronically Signed   By: Elmer Picker M.D.   On: 11/09/2021 19:08   DG Foot Complete Right  Result Date: 11/07/2021 CLINICAL DATA:  Swelling, redness.  Evaluate for osteomyelitis. EXAM: RIGHT FOOT COMPLETE - 3+ VIEW COMPARISON:  None. FINDINGS: There is no evidence of fracture or dislocation. There is no evidence of arthropathy or other focal bone abnormality. No bone destruction. Soft tissues are unremarkable. IMPRESSION: Negative. Electronically Signed   By: Rolm Baptise M.D.   On: 11/07/2021 03:09   VAS [place] ABI WITH/WO TBI  Result Date: 11/07/2021  LOWER EXTREMITY DOPPLER STUDY Patient Name:  Warren Gray  Date of Exam:   11/07/2021 Medical Rec #: [id number]     Accession #:    [id number] Date of Birth: 1955/12/04      Patient Gender: M Patient Age:   66 years Exam [facility]  [facility] Procedure:      VAS [place] ABI WITH/WO TBI Referring Phys: Anderson Malta YATES --------------------------------------------------------------------------------  Indications: Ulceration. High Risk Factors: Hypertension, Diabetes.  Comparison Study: No prior studies. Performing Technologist: Carlos Levering RVT  Examination Guidelines: A complete evaluation includes at minimum, Doppler waveform signals and systolic blood pressure reading at the level of bilateral brachial, anterior tibial, and posterior tibial arteries, when vessel segments are accessible. Bilateral testing is considered an integral part of a complete examination. Photoelectric Plethysmograph (PPG) waveforms and toe systolic pressure readings are included as required and additional duplex testing as needed. Limited examinations for reoccurring indications may be performed as noted.  ABI Findings: +---------+------------------+-----+---------+--------+  Right     Rt Pressure  (mmHg) Index Waveform  Comment   +---------+------------------+-----+---------+--------+  Brachial  153                      triphasic           +---------+------------------+-----+---------+--------+  PTA       118                0.77  biphasic            +---------+------------------+-----+---------+--------+  DP        113  0.74  biphasic            +---------+------------------+-----+---------+--------+  Great Toe 72                 0.47                      +---------+------------------+-----+---------+--------+ +---------+------------------+-----+---------+-------+  Left      Lt Pressure (mmHg) Index Waveform  Comment  +---------+------------------+-----+---------+-------+  Brachial  147                      triphasic          +---------+------------------+-----+---------+-------+  PTA       171                1.12  triphasic          +---------+------------------+-----+---------+-------+  DP        160                1.05  triphasic          +---------+------------------+-----+---------+-------+  Great Toe 94                 0.61                     +---------+------------------+-----+---------+-------+ +-------+-----------+-----------+------------+------------+  ABI/TBI Today's ABI Today's TBI Previous ABI Previous TBI  +-------+-----------+-----------+------------+------------+  Right   0.77        0.47                                   +-------+-----------+-----------+------------+------------+  Left    1.12        0.61                                   +-------+-----------+-----------+------------+------------+  Summary: Right: Resting right ankle-brachial index indicates moderate right lower extremity arterial disease. The right toe-brachial index is abnormal. Left: Resting left ankle-brachial index is within normal range. No evidence of significant left lower extremity arterial disease. The left toe-brachial index is abnormal.  *See table(s) above for measurements and observations.   Electronically signed by Servando Snare MD on 11/07/2021 at 3:12:01 PM.    Final    CT CHEST LUNG CA SCREEN LOW DOSE W/O CM  Result Date: 11/05/2021 CLINICAL DATA:  66 year old male with 25 pack-year history of smoking. Lung cancer screening. EXAM: CT CHEST WITHOUT CONTRAST LOW-DOSE FOR LUNG CANCER SCREENING TECHNIQUE: Multidetector CT imaging of the chest was performed following the standard protocol without IV contrast. COMPARISON:  None. FINDINGS: Cardiovascular: The heart size is normal. No substantial pericardial effusion. Coronary artery calcification is evident. Mild atherosclerotic calcification is noted in the wall of the thoracic aorta. Mediastinum/Nodes: No mediastinal lymphadenopathy. No evidence for gross hilar lymphadenopathy although assessment is limited by the lack of intravenous contrast on the current study. The esophagus has normal imaging features. There is no axillary lymphadenopathy. Lungs/Pleura: Subpleural reticulation noted in the lungs bilaterally. Multiple bilateral pulmonary nodules are identified including an irregular mixed attenuation posterior right lower lobe pulmonary nodule measuring 16 mm. More anteriorly in the left lower lobe is a 5.5 cm mixed attenuation lesion. Additional scattered tiny calcified and noncalcified pulmonary nodules are identified. No pleural effusion. Upper Abdomen: Unremarkable. Musculoskeletal: No worrisome lytic or sclerotic osseous abnormality. IMPRESSION: 1. Lung-RADS 4B, suspicious.  Additional imaging evaluation or consultation with Pulmonology or Thoracic Surgery recommended. 2 dominant sub solid lesions in the left lower lobe. Multifocal adenocarcinoma not excluded. Both lesions new since abdomen CT of 04/26/2018. PET-CT may prove helpful to further evaluate. 2.  Aortic Atherosclerois (ICD10-170.0) These results will be called to the ordering clinician or representative by the Radiologist Assistant, and communication documented in the PACS or [employer]. Electronically Signed   By: Misty Stanley M.D.   On: 11/05/2021 10:28   VAS [place] LOWER EXTREMITY VENOUS (DVT)  Result Date: 11/12/2021  Lower Venous DVT Study Patient Name:  Warren Gray  Date of Exam:   11/12/2021 Medical Rec #: [id number]     Accession #:    [id number] Date of Birth: Oct 26, 1956      Patient Gender: M Patient Age:   51 years Exam Location:  [facility] Procedure:      VAS [place] LOWER EXTREMITY VENOUS (DVT) Referring Phys: Kiarrah Rausch --------------------------------------------------------------------------------  Indications: Fever.  Risk Factors: Status post Right femoropopliteal angioplasty and stenting 11/12/2021. Limitations: Bandages. Comparison Study: Prior negative Right LEV done 11/07/2021 Performing Technologist: Darlin Coco RDMS, RVT  Examination Guidelines: A complete evaluation includes B-mode imaging, spectral Doppler, color Doppler, and power Doppler as needed of all accessible portions of each vessel. Bilateral testing is considered an integral part of a complete examination. Limited examinations for reoccurring indications may be performed as noted. The reflux portion of the exam is performed with the patient in reverse Trendelenburg.  +-------+---------------+---------+-----------+----------+--------------+  RIGHT   Compressibility Phasicity Spontaneity Properties Thrombus Aging  +-------+---------------+---------+-----------+----------+--------------+  CFV     Full            Yes       Yes                                    +-------+---------------+---------+-----------+----------+--------------+  SFJ     Full                                                             +-------+---------------+---------+-----------+----------+--------------+  FV Prox Full                                                             +-------+---------------+---------+-----------+----------+--------------+  PFV     Full                                                              +-------+---------------+---------+-----------+----------+--------------+  PTV     Full                                                             +-------+---------------+---------+-----------+----------+--------------+  PERO    Full                                                             +-------+---------------+---------+-----------+----------+--------------+  Gastroc Full                                                             +-------+---------------+---------+-----------+----------+--------------+   No evidence of right lower extremity deep venous thrombosis.   *See table(s) above for measurements and observations. Electronically signed by Jamelle Haring on 11/12/2021 at 3:13:49 PM.    Final    VAS [place] LOWER EXTREMITY VENOUS (DVT) (ONLY [facility] & [facility])  Result Date: 11/07/2021  Lower Venous DVT Study Patient Name:  Warren Gray  Date of Exam:   11/07/2021 Medical Rec #: [id number]     Accession #:    [id number] Date of Birth: [birth year]      Patient Gender: M Patient Age:   77 years Exam Location:  [facility] Procedure:      VAS [place] LOWER EXTREMITY VENOUS (DVT) Referring Phys: Regan Lemming --------------------------------------------------------------------------------  Indications: Swelling, and Erythema.  Risk Factors: None identified. Comparison Study: No prior studies. Performing Technologist: Oliver Hum RVT  Examination Guidelines: A complete evaluation includes B-mode imaging, spectral Doppler, color Doppler, and power Doppler as needed of all accessible portions of each vessel. Bilateral testing is considered an integral part of a complete examination. Limited examinations for reoccurring indications may be performed as noted. The reflux portion of the exam is performed with the patient in reverse Trendelenburg.  +---------+---------------+---------+-----------+----------+--------------+  RIGHT     Compressibility Phasicity Spontaneity Properties Thrombus Aging   +---------+---------------+---------+-----------+----------+--------------+  CFV       Full            Yes       Yes                                    +---------+---------------+---------+-----------+----------+--------------+  SFJ       Full                                                             +---------+---------------+---------+-----------+----------+--------------+  FV Prox   Full                                                             +---------+---------------+---------+-----------+----------+--------------+  FV Mid    Full                                                             +---------+---------------+---------+-----------+----------+--------------+  FV Distal Full                                                             +---------+---------------+---------+-----------+----------+--------------+  PFV       Full                                                             +---------+---------------+---------+-----------+----------+--------------+  POP       Full            Yes       Yes                                    +---------+---------------+---------+-----------+----------+--------------+  PTV       Full                                                             +---------+---------------+---------+-----------+----------+--------------+  PERO      Full                                                             +---------+---------------+---------+-----------+----------+--------------+   +----+---------------+---------+-----------+----------+--------------+  LEFT Compressibility Phasicity Spontaneity Properties Thrombus Aging  +----+---------------+---------+-----------+----------+--------------+  CFV  Full            Yes       Yes                                    +----+---------------+---------+-----------+----------+--------------+     Summary: RIGHT: - There is no evidence of deep vein thrombosis in the lower extremity.  - No cystic structure found in the popliteal fossa.   LEFT: - No evidence of common femoral vein obstruction.  *See table(s) above for measurements and observations. Electronically signed by Lemar Livings MD on 11/07/2021 at 3:11:39 PM.    Final     Microbiology: Results for orders placed or performed during the hospital encounter of 11/06/21  Resp Panel by RT-PCR (Flu A&B, Covid) Nasopharyngeal Swab     Status: None   Collection Time: 11/06/21 11:10 PM   Specimen: Nasopharyngeal Swab; Nasopharyngeal(NP) swabs in vial transport medium  Result Value Ref Range Status   SARS Coronavirus 2 by RT PCR NEGATIVE NEGATIVE Final    Comment: (NOTE) SARS-CoV-2 target nucleic acids are NOT DETECTED.  The SARS-CoV-2 RNA is generally detectable in upper respiratory specimens during the acute phase of infection. The lowest concentration of SARS-CoV-2 viral copies this assay can detect is 138 copies/mL. A negative result does not preclude SARS-Cov-2 infection and should not be used as the sole basis  for treatment or other patient management decisions. A negative result may occur with  improper specimen collection/handling, submission of specimen other than nasopharyngeal swab, presence of viral mutation(s) within the areas targeted by this assay, and inadequate number of viral copies(<138 copies/mL). A negative result must be combined with clinical observations, patient history, and epidemiological information. The expected result is Negative.  Fact Sheet for Patients:  EntrepreneurPulse.com.au  Fact Sheet for Healthcare Providers:  IncredibleEmployment.be  This test is no t yet approved or cleared by the [place] FDA and  has been authorized for detection and/or diagnosis of SARS-CoV-2 by FDA under an Emergency Use Authorization (EUA). This EUA will remain  in effect (meaning this test can be used) for the duration of the COVID-19 declaration under Section 564(b)(1) of the Act, 21 U.S.C.section 360bbb-3(b)(1),  unless the authorization is terminated  or revoked sooner.       Influenza A by PCR NEGATIVE NEGATIVE Final   Influenza B by PCR NEGATIVE NEGATIVE Final    Comment: (NOTE) The Xpert Xpress SARS-CoV-2/FLU/RSV plus assay is intended as an aid in the diagnosis of influenza from Nasopharyngeal swab specimens and should not be used as a sole basis for treatment. Nasal washings and aspirates are unacceptable for Xpert Xpress SARS-CoV-2/FLU/RSV testing.  Fact Sheet for Patients: EntrepreneurPulse.com.au  Fact Sheet for Healthcare Providers: IncredibleEmployment.be  This test is not yet approved or cleared by the [place] FDA and has been authorized for detection and/or diagnosis of SARS-CoV-2 by FDA under an Emergency Use Authorization (EUA). This EUA will remain in effect (meaning this test can be used) for the duration of the COVID-19 declaration under Section 564(b)(1) of the Act, 21 U.S.C. section 360bbb-3(b)(1), unless the authorization is terminated or revoked.  Performed at [facility], [place] [address], [place], [place] [address]   Blood culture (routine x 2)     Status: None   Collection Time: 11/06/21 11:23 PM   Specimen: BLOOD  Result Value Ref Range Status   Specimen Description   Final    BLOOD LEFT HAND Performed at [place] [address], [place], [place] [address]    Special Requests   Final    BOTTLES DRAWN AEROBIC AND ANAEROBIC Blood Culture adequate volume Performed at [facility] [address], [place], [place] [address]    Culture   Final    NO GROWTH 5 DAYS Performed at [facility], [place] [address], [place], [place] [address]    Report Status 11/12/2021 FINAL  Final  Culture, blood (routine x 2)     Status: None (Preliminary result)   Collection Time: 11/11/21  5:29 PM   Specimen: BLOOD  Result Value Ref Range Status   Specimen Description    Final    BLOOD WEAKLY REACTIVE Performed at [place] [address], [place], [place] [address]    Special Requests   Final    Blood Culture adequate volume BOTTLES DRAWN AEROBIC AND ANAEROBIC Performed at [place] [address], [place], [place] [address]    Culture   Final    NO GROWTH 3 DAYS Performed at [facility], [place] [address], [place],  [address]    Report Status PENDING  Incomplete  Culture, blood (routine x 2)     Status: None (Preliminary result)   Collection Time: 11/11/21  5:29 PM   Specimen: BLOOD  Result Value Ref Range Status   Specimen  Description   Final    BLOOD RIGHT ANTECUBITAL Performed at [facility] [address], [place], [place] [address]    Special Requests   Final    BOTTLES DRAWN AEROBIC ONLY Blood Culture results may not be optimal due to an inadequate volume of blood received in culture bottles Performed at [place] [address], [place], [place] [address]    Culture   Final    NO GROWTH 3 DAYS Performed at [facility], [place] [address], [place], [place] [address]    Report Status PENDING  Incomplete  MRSA Next Gen by PCR, Nasal     Status: None   Collection Time: 11/13/21 11:16 AM   Specimen: Nasal Mucosa; Nasal Swab  Result Value Ref Range Status   MRSA by PCR Next Gen NOT DETECTED NOT DETECTED Final    Comment: (NOTE) The GeneXpert MRSA Assay (FDA approved for NASAL specimens only), is one component of a comprehensive MRSA colonization surveillance program. It is not intended to diagnose MRSA infection nor to guide or monitor treatment for MRSA infections. Test performance is not FDA approved in patients less than 65 years old. Performed at [facility], [place] [address], [place], [place] [address]     Labs: CBC: Recent Labs  Lab 11/10/21 0328 11/11/21 0319 11/11/21 1729 11/12/21 0359 11/13/21 0310   WBC 11.1* 8.0 8.0 8.3 8.1  NEUTROABS 8.2* 5.9  --  5.9 5.7  HGB 10.7* 11.0* 10.7* 10.9* 11.0*  HCT 32.9* 33.5* 33.2* 33.8* 33.8*  MCV 98.5 98.8 100.3* 98.8 101.5*  PLT 209 224 258 286 [id number]   Basic Metabolic Panel: Recent Labs  Lab 11/09/21 0316 11/10/21 0328 11/11/21 0319 11/12/21 0359 11/13/21 0310  NA 134* 134* 135 135 130*  K 3.3* 3.3* 3.5 3.8 3.7  CL 100 103 102 105 104  CO2 27 23 23 23 22   GLUCOSE 141* 117* 124* 111* 130*  BUN 13 11 15 12 11   CREATININE 0.95 0.88 0.58* 0.75 0.86  CALCIUM 8.7* 8.4* 8.7* 8.3* 7.9*  MG  --  2.1 2.4 2.2 2.3   Liver Function Tests: Recent Labs  Lab 11/13/21 0310  AST 47*  ALT 67*  ALKPHOS 123  BILITOT 0.6  PROT 6.8  ALBUMIN 2.7*   CBG: Recent Labs  Lab 11/13/21 1202 11/13/21 1651 11/13/21 2140 11/14/21 0730 11/14/21 1336  GLUCAP 99 138* 157* 112* 130*    Discharge time spent: greater than 30 minutes.  Signed: Berle Mull, MD [facility] 11/14/2021

## 2021-11-14 NOTE — Progress Notes (Signed)
I have explained everything I have done today with patient and his wife including medication, plan of care, discharge with paperwork.  Pts wife is very disrespectful and aggressive with all the staff.

## 2021-11-14 NOTE — Progress Notes (Signed)
Orthopedic Tech Progress Note Patient Details:  Warren Gray 24-Aug-1956 [id number]  Ortho Devices Type of Ortho Device: CAM walker Ortho Device/Splint Interventions: Ordered   Post Interventions Instructions Provided: Adjustment of device, Care of device  [facility] 11/14/2021, 11:32 AM

## 2021-11-14 NOTE — Progress Notes (Signed)
Orthopedic Tech Progress Note Patient Details:  Warren Gray 05/08/1956 [id number]  Patient ID: [name], male   DOB: 02-Feb-1956, 66 y.o.   MRN: [id number] Contacted Ortho tech supervisor because we are currently out of unna boot supply's. We will apply them once the wrap/paste arrives. Warren Gray 11/14/2021, 10:02 AM

## 2021-11-14 NOTE — Progress Notes (Signed)
Physical Therapy Treatment Patient Details Name: Warren Gray MRN: [id number] DOB: 1956-10-22 Today's Date: 11/14/2021   History of Present Illness 66 yo male admitted with diabetic foot infection/cellulitis. Hx of DM.    PT Comments    Limited session due to aggressive behavior by Spouse.  Pt was given a post op shoe that he "threw across the room" reported by RN.  "This is NOT what I need".  Discussed with MD pt requesting a CAM Boot whether or not that was needed for pt's foot cellulitis.  Arrived to room and introduced myself.  General Comments: AxO x 3 however attempts to conversive with pt was continuously interupted by his spouse who appeared angry and defensive.  Spouse stated, "you are not going to come in here and speak to him like that".  Was unable to truely get pt to answer a questions.  Spouse continued to blurt out these irrational allegations and asked me to leave the room.  "What's your name?" "You don't know what you are doing". "I'm his wife, I'm the only one who can help him". All spoken with an elevated voice and forward approach. All attempts to defuse her behavior and address the pt's needs were unsuccessful.  Was able to Educate pt on some TE's of AP and LE elevation. He declined to walk until "I get my boot I asked for".   Although CAM Boot is not ideal for pt's Dx Cellulitis in foot, MD agreed to order and D/C pt to home today.    Recommendations for follow up therapy are one component of a multi-disciplinary discharge planning process, led by the attending physician.  Recommendations may be updated based on patient status, additional functional criteria and insurance authorization.  Follow Up Recommendations  No PT follow up     Assistance Recommended at Discharge Intermittent Supervision/Assistance  Patient can return home with the following Help with stairs or ramp for entrance;Assist for transportation;A little help with walking and/or transfers;A little help with  bathing/dressing/bathroom   Equipment Recommendations  Rolling walker (2 wheels)    Recommendations for Other Services       Precautions / Restrictions Precautions Precautions: Fall Restrictions Weight Bearing Restrictions: No Other Position/Activity Restrictions: WBAT     Mobility  Bed Mobility      Attempted but not able to complete              Transfers      Attempted but unable to complete                  Ambulation/Gait                   Stairs             Wheelchair Mobility    Modified Rankin (Stroke Patients Only)       Balance                                            Cognition Arousal/Alertness: Awake/alert Behavior During Therapy: WFL for tasks assessed/performed Overall Cognitive Status: Within Functional Limits for tasks assessed                                 General Comments: AxO x 3 however attempts to conversive with pt was continuously interupted by his spouse  who appeared angry and defensive.  Spouse stated, "you are not going to come in here and speak to him like that".  Was unable to truely get pt to answer a questions.  Spouse continued to blurt out these irrational allegations and asked me to leave the room.  "What's your name?" "You don't know what you are doing". "I'm his wife, I'm the only one who can help him". All spoken with an elevated voice and forward approach.        Exercises      General Comments        Pertinent Vitals/Pain Pain Assessment: Faces Faces Pain Scale: Hurts whole lot Pain Location: R LE Pain Descriptors / Indicators: Discomfort;Sore;Grimacing;Guarding Pain Intervention(s): Monitored during session    Home Living                          Prior Function            PT Goals (current goals can now be found in the care plan section) Progress towards PT goals: Progressing toward goals    Frequency    Min 3X/week       PT Plan Current plan remains appropriate    Co-evaluation              AM-PAC PT "6 Clicks" Mobility   Outcome Measure  Help needed turning from your back to your side while in a flat bed without using bedrails?: A Little Help needed moving from lying on your back to sitting on the side of a flat bed without using bedrails?: A Little Help needed moving to and from a bed to a chair (including a wheelchair)?: A Little Help needed standing up from a chair using your arms (e.g., wheelchair or bedside chair)?: A Little Help needed to walk in hospital room?: A Little Help needed climbing 3-5 steps with a railing? : A Little 6 Click Score: 18    End of Session   Activity Tolerance: Other (comment) (session limited by Spouse) Patient left: in bed;with call bell/phone within reach;with bed alarm set Nurse Communication: Mobility status PT Visit Diagnosis: Pain;Difficulty in walking, not elsewhere classified (R26.2) Pain - Right/Left: Right Pain - part of body: Ankle and joints of foot;Leg     Time: 1050-1102 PT Time Calculation (min) (ACUTE ONLY): 12 min  Charges:  $Self Care/Home Management: 8-22                       Felecia Shelling  PTA Acute  Rehabilitation Services Pager      [phone] Office      [phone]

## 2021-11-14 NOTE — Progress Notes (Signed)
Dressing change completed per order, patient tolerated well, medicated afterwards with prn oxycodone.

## 2021-11-16 LAB — CULTURE, BLOOD (ROUTINE X 2)
Culture: NO GROWTH
Culture: NO GROWTH
Special Requests: ADEQUATE
Specimen Description: REACTIVE

## 2021-11-25 IMAGING — CR DG LUMBAR SPINE COMPLETE 4+V
5 series · 5 of 5 positions shown · non-contrast
Comparison: 06/08/2021

CLINICAL DATA: Chronic low back pain

EXAM:
LUMBAR SPINE - COMPLETE 4+ VIEW

[t lumbar spine ap]
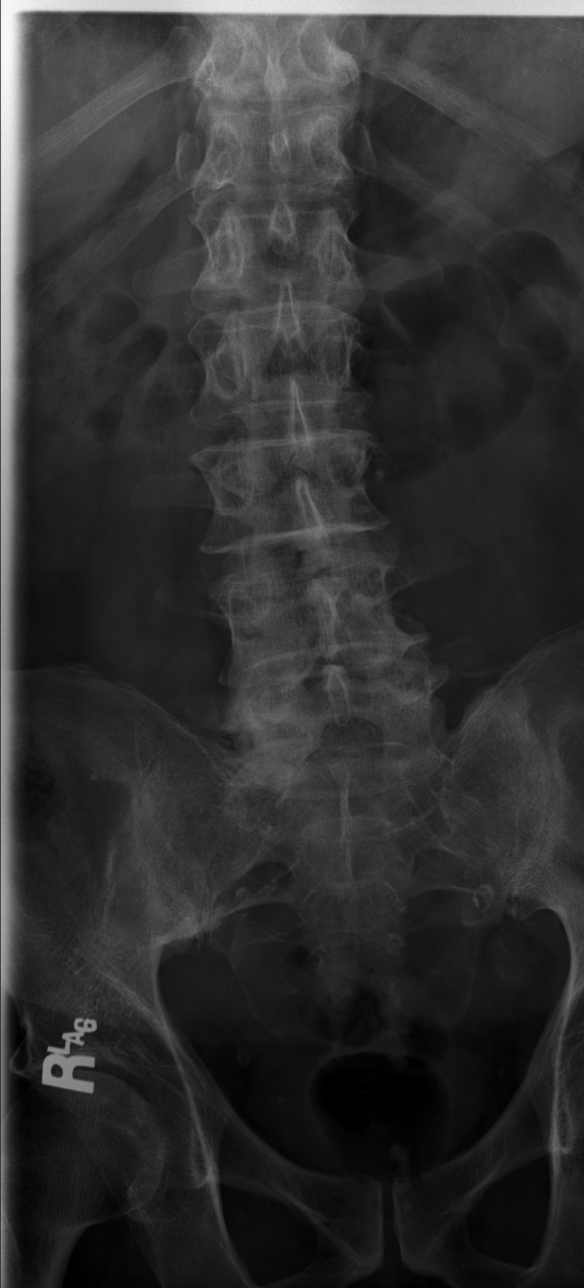

[t lumbar spine obl (1 of 2)]
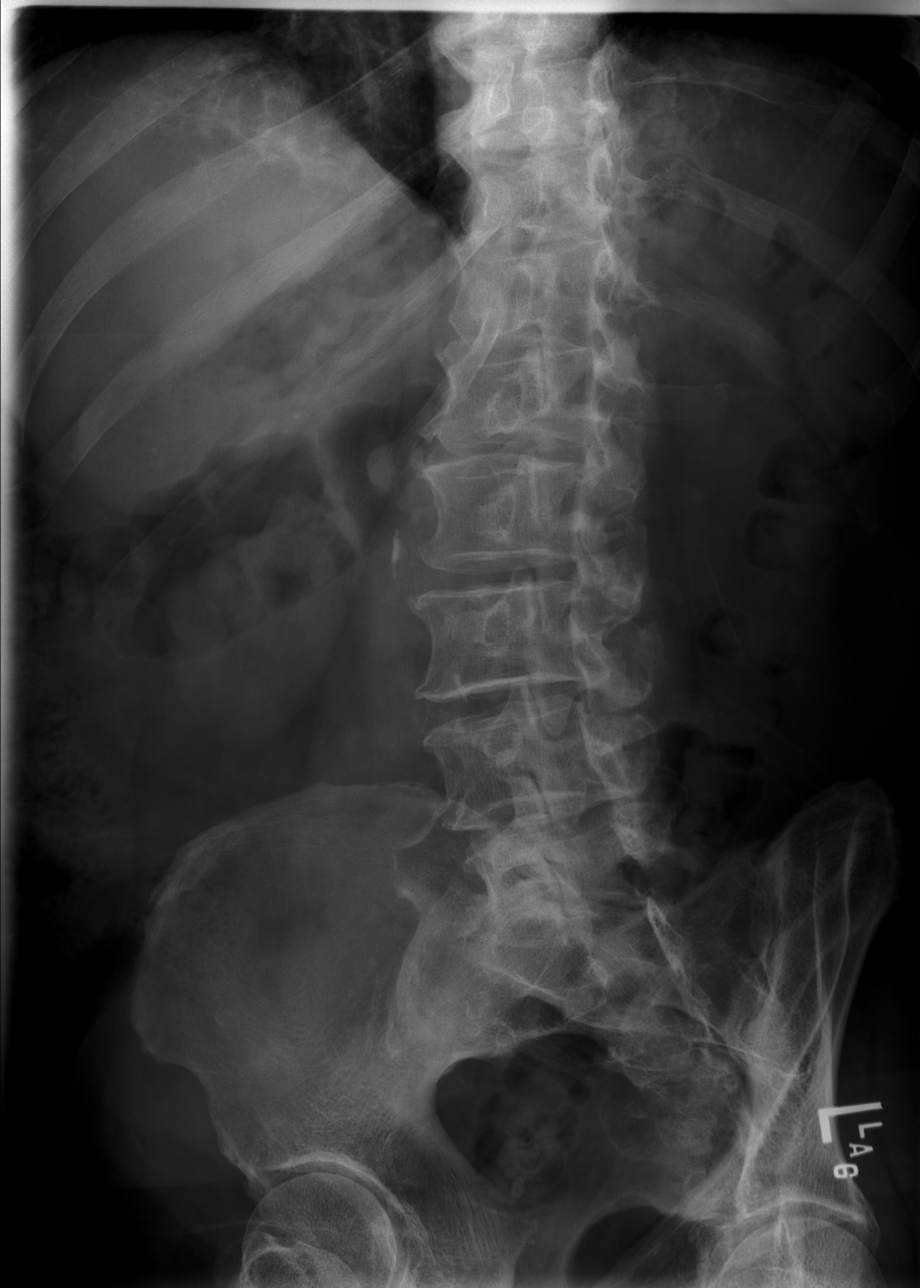

[t lumbar spine obl (2 of 2)]
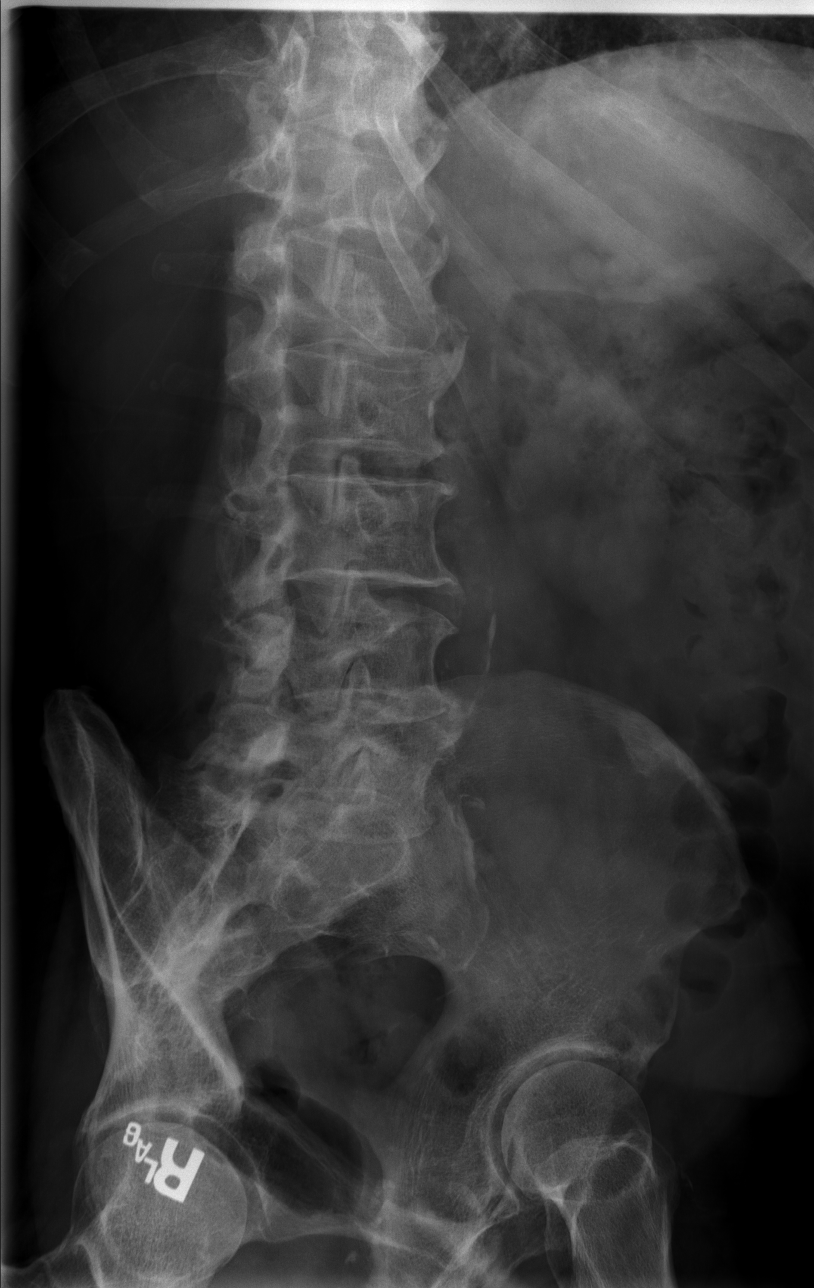

[t lumbar spine lat]
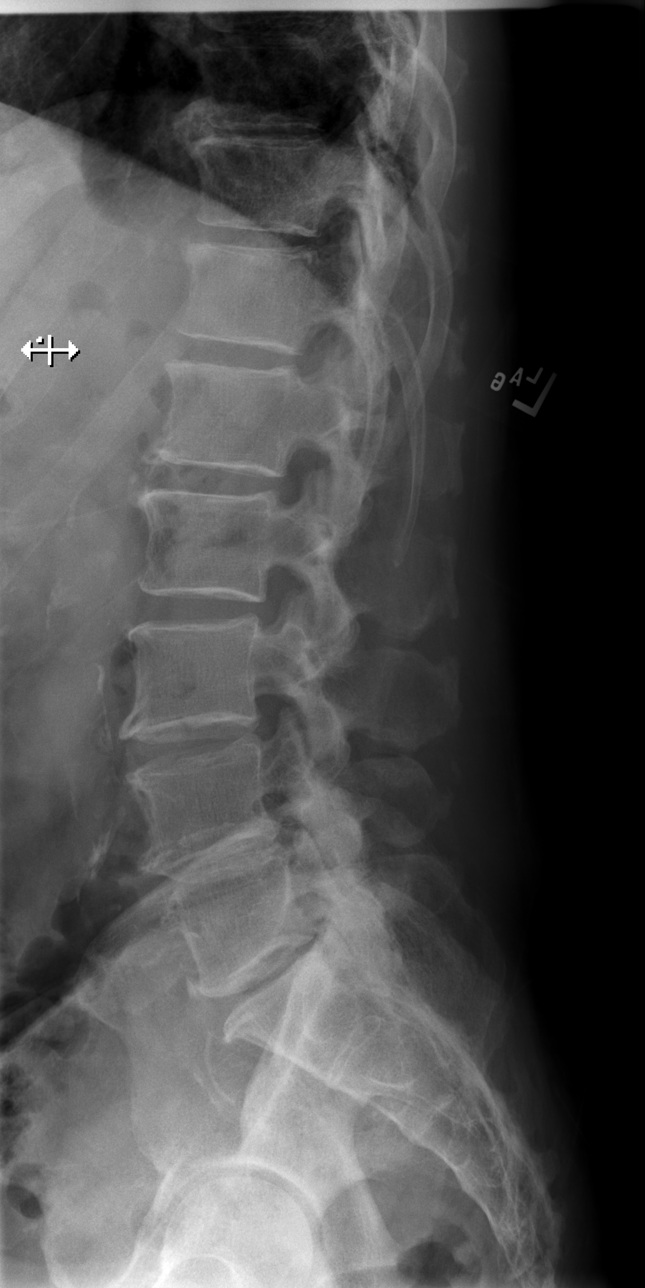

[t lumbar l-5 s-1 spot]
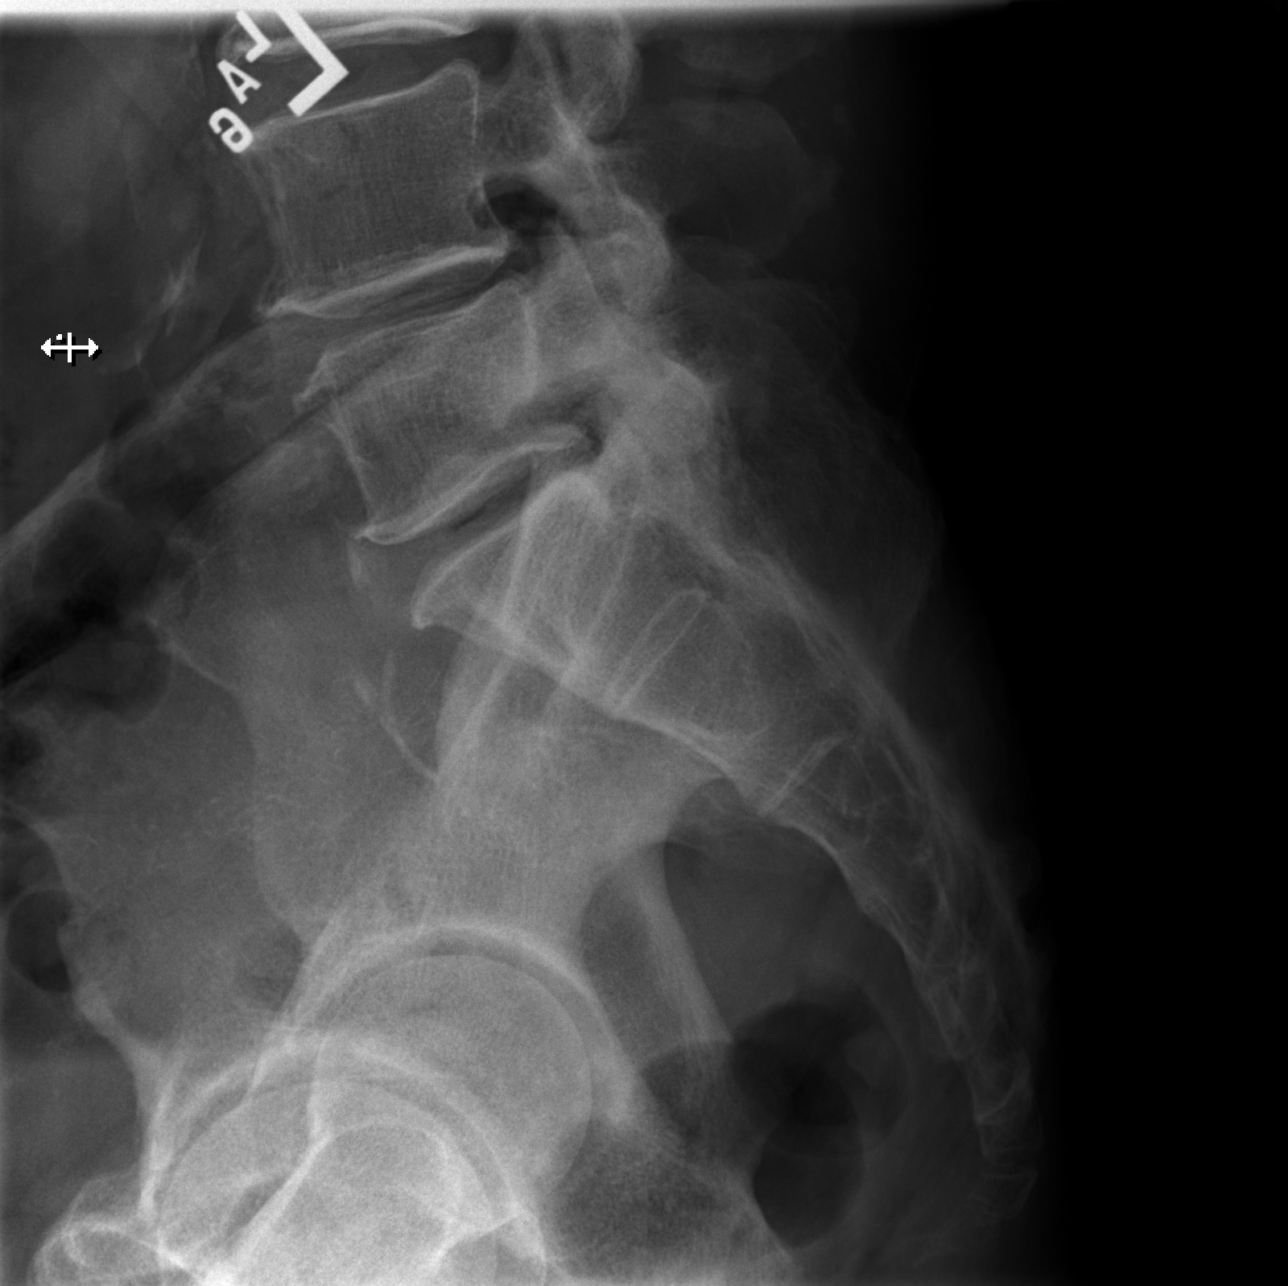

[5 of 5 positions shown; findings below may reference images not displayed]

FINDINGS: Normal lumbar lordosis. No acute fracture or listhesis of the lumbar
spine. Vertebral body height has been preserved. There is
intervertebral disc space narrowing and endplate remodeling
throughout the lumbar spine, most severe at L3-S1 in keeping with
changes of mild to moderate degenerative disc disease. Mild facet
arthrosis at L4-S1 is not well profiled on this examination. These
changes appears stable since prior examination. Vascular
calcifications are seen within the abdominal aorta. The paraspinal
soft tissues are otherwise unremarkable.
IMPRESSION: Stable degenerative changes, most severe within the lower lumbar
spine.

No acute fracture or listhesis.

## 2021-11-25 IMAGING — CR DG THORACIC SPINE 2V
3 series · 3 of 3 positions shown · non-contrast
Comparison: None.

CLINICAL DATA: Back pain

EXAM:
THORACIC SPINE 2 VIEWS

[t thoracic spine ap]
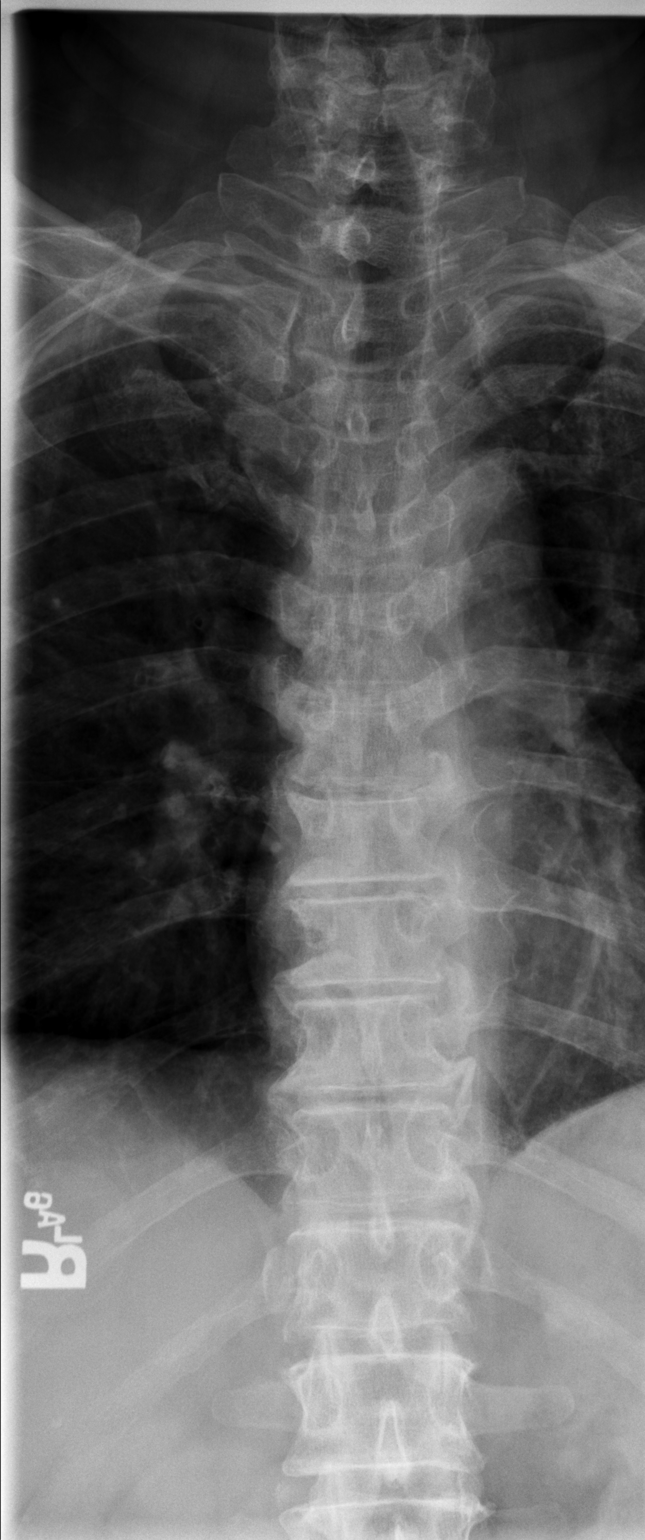

[t thoracic breathing lat]
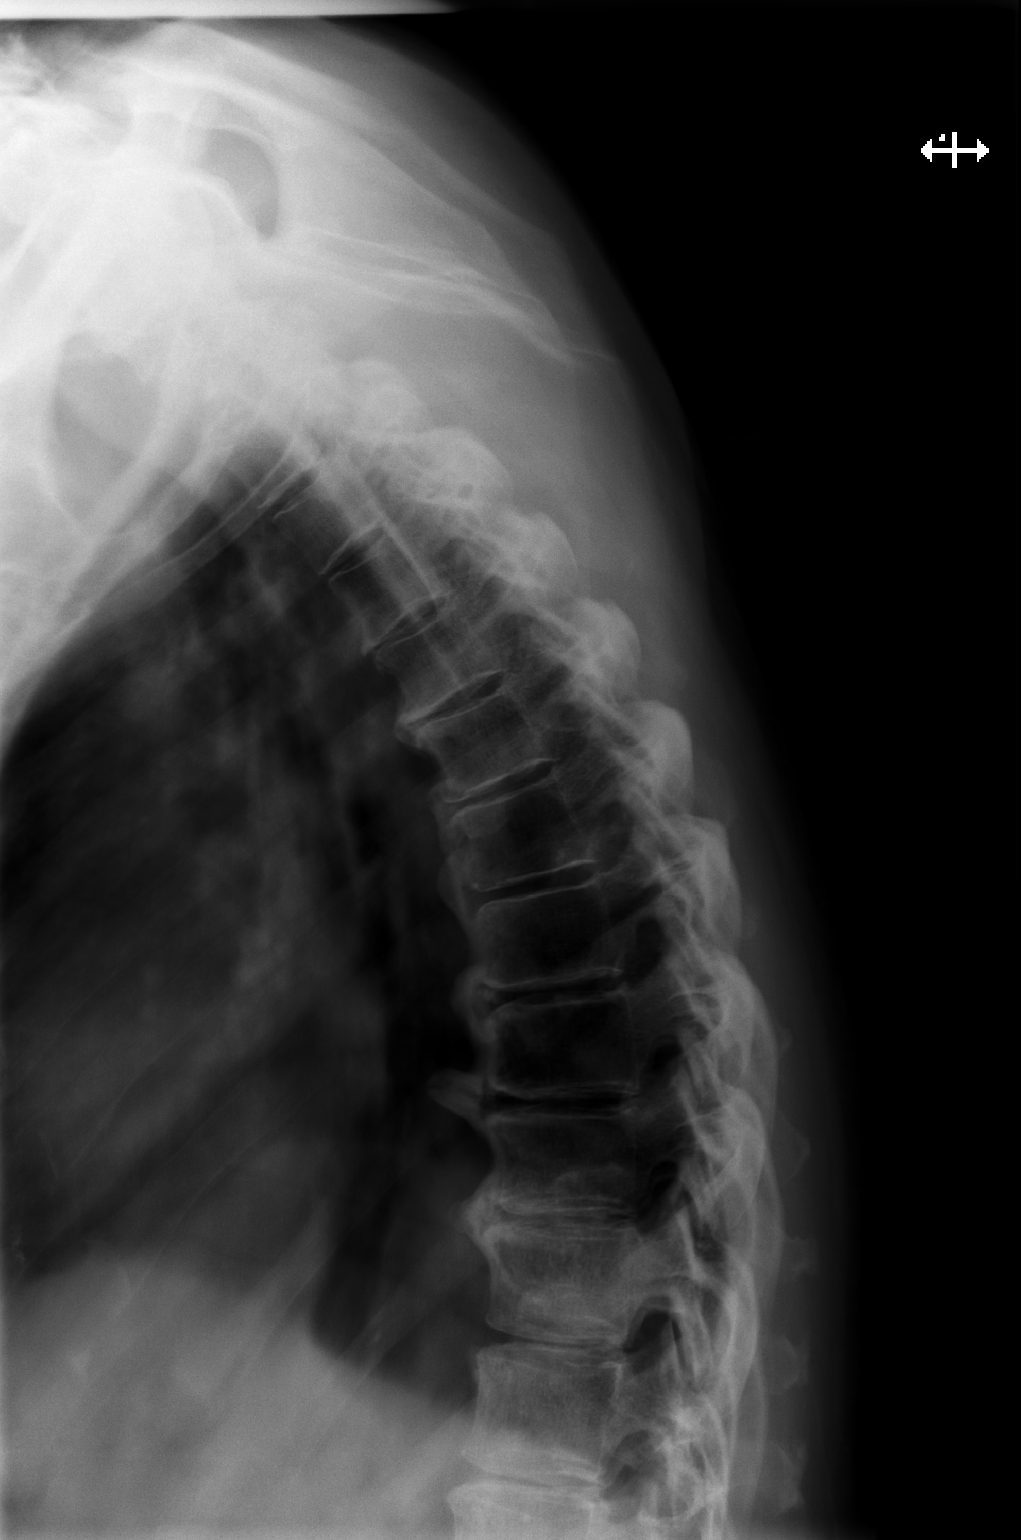

[t thoracic swimmers]
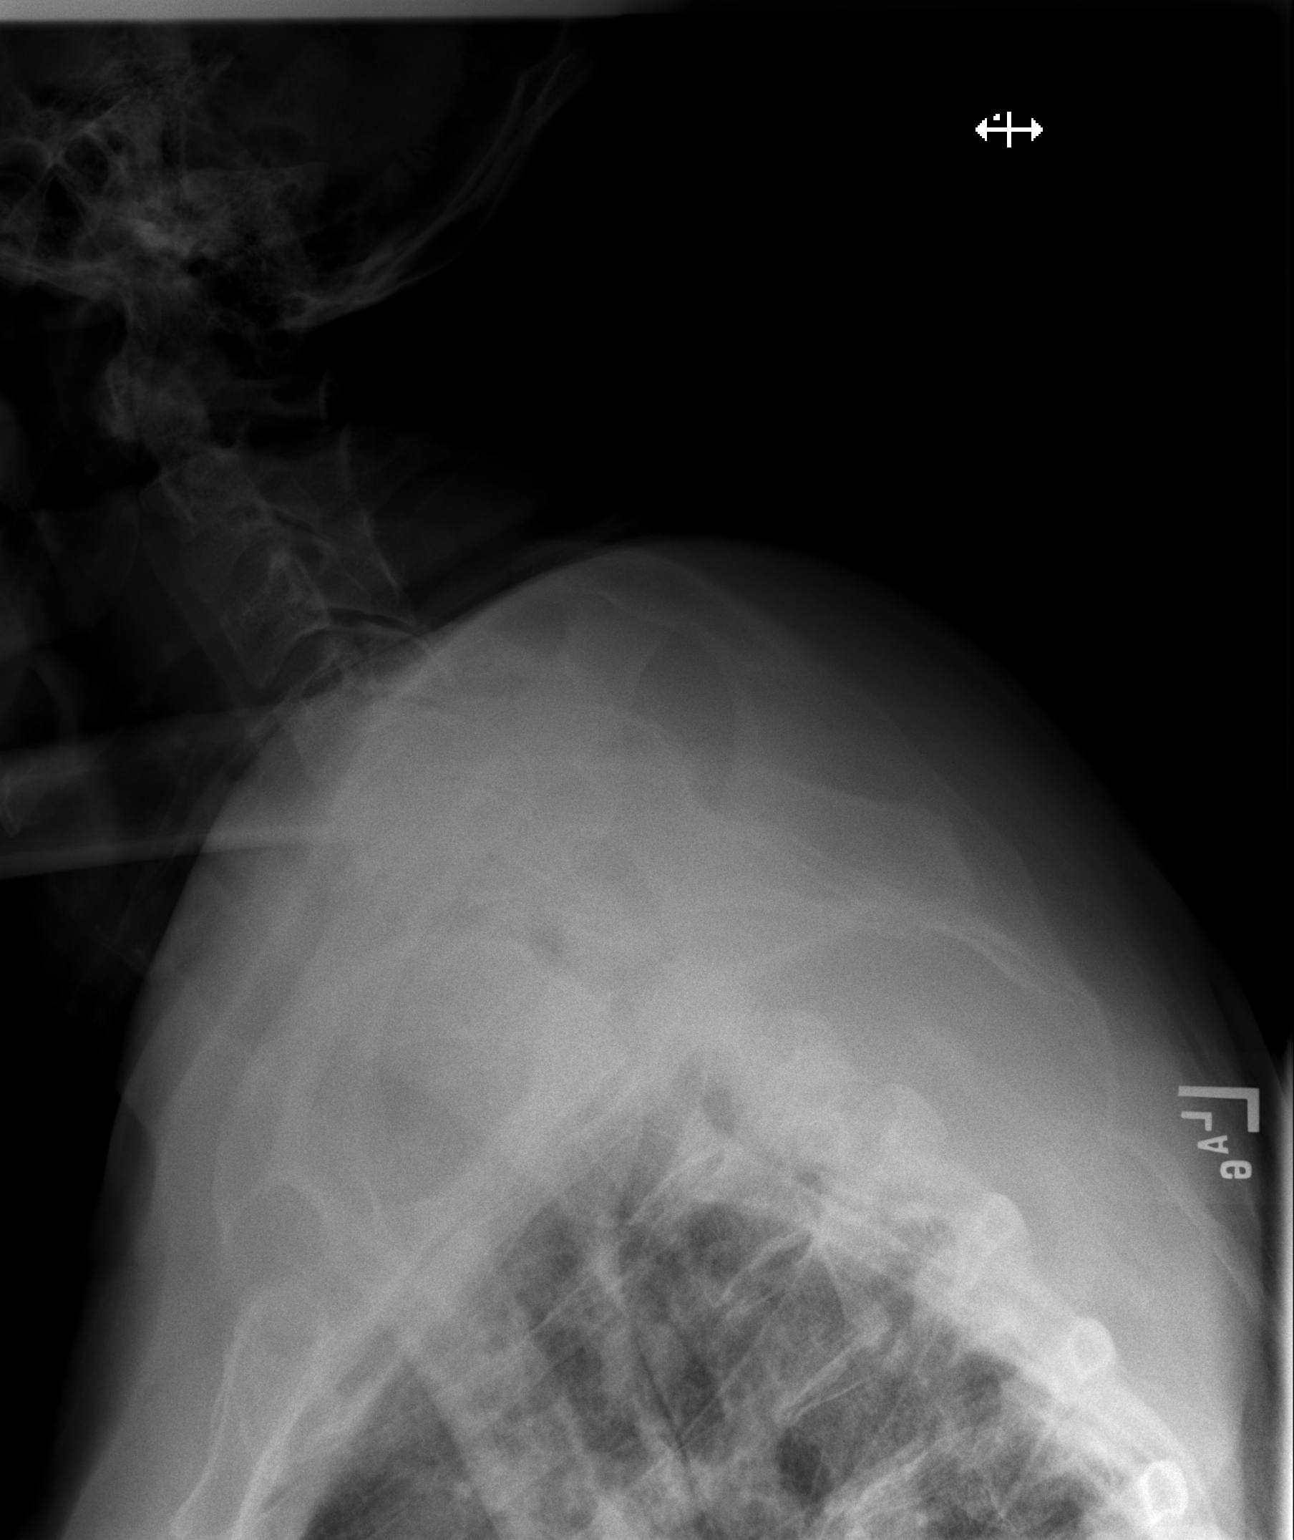

[3 of 3 positions shown; findings below may reference images not displayed]

FINDINGS: Normal thoracic kyphosis. No acute fracture or listhesis of the
thoracic spine. Vertebral body height has been preserved. There is
endplate remodeling throughout the mid and lower thoracic spine,
most severe at T7-11 in keeping with changes of mild to moderate
degenerative disc disease. The paraspinal soft tissues are
unremarkable.
IMPRESSION: No acute fracture or listhesis.

## 2021-12-07 ENCOUNTER — Other Ambulatory Visit: Payer: Self-pay

## 2021-12-07 DIAGNOSIS — I70221 Atherosclerosis of native arteries of extremities with rest pain, right leg: Secondary | ICD-10-CM

## 2021-12-13 ENCOUNTER — Inpatient Hospital Stay (HOSPITAL_COMMUNITY): Admit: 2021-12-13 | Payer: Medicare HMO

## 2021-12-13 ENCOUNTER — Encounter (HOSPITAL_COMMUNITY): Payer: Medicare HMO

## 2022-11-03 ENCOUNTER — Other Ambulatory Visit (HOSPITAL_COMMUNITY): Payer: Self-pay
